# Patient Record
Sex: Male | Born: 1950 | Race: White | Hispanic: No | Marital: Married | Smoking: Current every day smoker
Health system: Southern US, Community
[De-identification: ages and names within clinical notes are randomized; demographics above are authoritative.]

## PROBLEM LIST (undated history)

## (undated) DIAGNOSIS — Z8619 Personal history of other infectious and parasitic diseases: Secondary | ICD-10-CM

## (undated) DIAGNOSIS — M1990 Unspecified osteoarthritis, unspecified site: Secondary | ICD-10-CM

## (undated) DIAGNOSIS — G8929 Other chronic pain: Secondary | ICD-10-CM

## (undated) DIAGNOSIS — I10 Essential (primary) hypertension: Secondary | ICD-10-CM

## (undated) DIAGNOSIS — F419 Anxiety disorder, unspecified: Secondary | ICD-10-CM

## (undated) DIAGNOSIS — T7840XA Allergy, unspecified, initial encounter: Secondary | ICD-10-CM

## (undated) DIAGNOSIS — Z87442 Personal history of urinary calculi: Secondary | ICD-10-CM

## (undated) DIAGNOSIS — F17200 Nicotine dependence, unspecified, uncomplicated: Secondary | ICD-10-CM

## (undated) DIAGNOSIS — K219 Gastro-esophageal reflux disease without esophagitis: Secondary | ICD-10-CM

## (undated) DIAGNOSIS — R011 Cardiac murmur, unspecified: Secondary | ICD-10-CM

## (undated) DIAGNOSIS — E119 Type 2 diabetes mellitus without complications: Secondary | ICD-10-CM

## (undated) DIAGNOSIS — M545 Low back pain, unspecified: Secondary | ICD-10-CM

## (undated) DIAGNOSIS — Z87438 Personal history of other diseases of male genital organs: Secondary | ICD-10-CM

## (undated) HISTORY — DX: Nicotine dependence, unspecified, uncomplicated: F17.200

## (undated) HISTORY — DX: Other chronic pain: G89.29

## (undated) HISTORY — DX: Personal history of urinary calculi: Z87.442

## (undated) HISTORY — DX: Type 2 diabetes mellitus without complications: E11.9

## (undated) HISTORY — DX: Low back pain: M54.5

## (undated) HISTORY — DX: Low back pain, unspecified: M54.50

## (undated) HISTORY — DX: Unspecified osteoarthritis, unspecified site: M19.90

## (undated) HISTORY — PX: FINGER SURGERY: SHX640

## (undated) HISTORY — DX: Cardiac murmur, unspecified: R01.1

## (undated) HISTORY — DX: Allergy, unspecified, initial encounter: T78.40XA

## (undated) HISTORY — DX: Anxiety disorder, unspecified: F41.9

## (undated) HISTORY — DX: Gastro-esophageal reflux disease without esophagitis: K21.9

## (undated) HISTORY — DX: Personal history of other diseases of male genital organs: Z87.438

## (undated) HISTORY — DX: Personal history of other infectious and parasitic diseases: Z86.19

---

## 1997-08-30 ENCOUNTER — Emergency Department (HOSPITAL_COMMUNITY): Admission: EM | Admit: 1997-08-30 | Discharge: 1997-08-30 | Payer: Self-pay | Admitting: Emergency Medicine

## 2003-10-05 ENCOUNTER — Emergency Department (HOSPITAL_COMMUNITY): Admission: EM | Admit: 2003-10-05 | Discharge: 2003-10-05 | Payer: Self-pay | Admitting: Emergency Medicine

## 2004-05-01 ENCOUNTER — Ambulatory Visit: Payer: Self-pay | Admitting: Family Medicine

## 2004-08-21 ENCOUNTER — Ambulatory Visit: Payer: Self-pay | Admitting: Family Medicine

## 2005-05-03 ENCOUNTER — Encounter: Admission: RE | Admit: 2005-05-03 | Discharge: 2005-05-03 | Payer: Self-pay | Admitting: Orthopaedic Surgery

## 2005-07-19 ENCOUNTER — Ambulatory Visit: Payer: Self-pay | Admitting: Family Medicine

## 2005-09-25 ENCOUNTER — Ambulatory Visit: Payer: Self-pay | Admitting: Family Medicine

## 2006-07-07 ENCOUNTER — Ambulatory Visit: Payer: Self-pay | Admitting: Family Medicine

## 2007-01-19 ENCOUNTER — Emergency Department (HOSPITAL_COMMUNITY): Admission: EM | Admit: 2007-01-19 | Discharge: 2007-01-19 | Payer: Self-pay | Admitting: *Deleted

## 2007-06-08 ENCOUNTER — Ambulatory Visit: Payer: Self-pay | Admitting: Family Medicine

## 2007-06-08 DIAGNOSIS — J1189 Influenza due to unidentified influenza virus with other manifestations: Secondary | ICD-10-CM | POA: Insufficient documentation

## 2007-06-08 LAB — CONVERTED CEMR LAB: Inflenza A Ag: POSITIVE

## 2007-06-09 ENCOUNTER — Encounter: Payer: Self-pay | Admitting: Internal Medicine

## 2007-06-09 DIAGNOSIS — A6920 Lyme disease, unspecified: Secondary | ICD-10-CM

## 2007-06-09 DIAGNOSIS — Z87448 Personal history of other diseases of urinary system: Secondary | ICD-10-CM

## 2007-08-11 ENCOUNTER — Ambulatory Visit: Payer: Self-pay | Admitting: Family Medicine

## 2007-08-11 DIAGNOSIS — R439 Unspecified disturbances of smell and taste: Secondary | ICD-10-CM

## 2008-05-06 DIAGNOSIS — E119 Type 2 diabetes mellitus without complications: Secondary | ICD-10-CM

## 2008-05-06 HISTORY — DX: Type 2 diabetes mellitus without complications: E11.9

## 2008-07-05 ENCOUNTER — Ambulatory Visit: Payer: Self-pay | Admitting: Internal Medicine

## 2008-07-05 DIAGNOSIS — M545 Low back pain: Secondary | ICD-10-CM

## 2008-08-04 ENCOUNTER — Ambulatory Visit: Payer: Self-pay | Admitting: Family Medicine

## 2008-08-05 ENCOUNTER — Encounter: Payer: Self-pay | Admitting: Family Medicine

## 2008-08-18 ENCOUNTER — Ambulatory Visit: Payer: Self-pay | Admitting: Family Medicine

## 2008-08-24 ENCOUNTER — Ambulatory Visit: Payer: Self-pay | Admitting: Family Medicine

## 2008-10-04 ENCOUNTER — Telehealth: Payer: Self-pay | Admitting: Family Medicine

## 2008-12-08 ENCOUNTER — Encounter (INDEPENDENT_AMBULATORY_CARE_PROVIDER_SITE_OTHER): Payer: Self-pay | Admitting: *Deleted

## 2009-02-23 ENCOUNTER — Ambulatory Visit: Payer: Self-pay | Admitting: Family Medicine

## 2009-02-23 LAB — CONVERTED CEMR LAB
ALT: 29 units/L (ref 0–53)
AST: 20 units/L (ref 0–37)
Albumin: 4 g/dL (ref 3.5–5.2)
Alkaline Phosphatase: 74 units/L (ref 39–117)
BUN: 12 mg/dL (ref 6–23)
Basophils Absolute: 0.2 10*3/uL — ABNORMAL HIGH (ref 0.0–0.1)
Basophils Relative: 1.6 % (ref 0.0–3.0)
Bilirubin, Direct: 0 mg/dL (ref 0.0–0.3)
CO2: 25 meq/L (ref 19–32)
Calcium: 9.3 mg/dL (ref 8.4–10.5)
Chloride: 107 meq/L (ref 96–112)
Cholesterol: 187 mg/dL (ref 0–200)
Creatinine, Ser: 0.9 mg/dL (ref 0.4–1.5)
Direct LDL: 133.5 mg/dL
Eosinophils Absolute: 0.3 10*3/uL (ref 0.0–0.7)
Eosinophils Relative: 2.7 % (ref 0.0–5.0)
GFR calc non Af Amer: 92.27 mL/min (ref >60)
Glucose, Bld: 127 mg/dL — ABNORMAL HIGH (ref 70–99)
HCT: 46.7 % (ref 39.0–52.0)
HDL: 29.8 mg/dL — ABNORMAL LOW (ref >39.00)
Hemoglobin: 15.5 g/dL (ref 13.0–17.0)
Lymphocytes Relative: 41.2 % (ref 12.0–46.0)
Lymphs Abs: 4.7 10*3/uL — ABNORMAL HIGH (ref 0.7–4.0)
MCHC: 33.3 g/dL (ref 30.0–36.0)
MCV: 96 fL (ref 78.0–100.0)
Monocytes Absolute: 0.7 10*3/uL (ref 0.1–1.0)
Monocytes Relative: 6 % (ref 3.0–12.0)
Neutro Abs: 5.4 10*3/uL (ref 1.4–7.7)
Neutrophils Relative %: 48.5 % (ref 43.0–77.0)
PSA: 0.52 ng/mL (ref 0.10–4.00)
Platelets: 240 10*3/uL (ref 150.0–400.0)
Potassium: 4 meq/L (ref 3.5–5.1)
RBC: 4.86 M/uL (ref 4.22–5.81)
RDW: 12.8 % (ref 11.5–14.6)
Sodium: 141 meq/L (ref 135–145)
Total Bilirubin: 0.5 mg/dL (ref 0.3–1.2)
Total CHOL/HDL Ratio: 6
Total Protein: 6.5 g/dL (ref 6.0–8.3)
Triglycerides: 217 mg/dL — ABNORMAL HIGH (ref 0.0–149.0)
VLDL: 43.4 mg/dL — ABNORMAL HIGH (ref 0.0–40.0)
WBC: 11.3 10*3/uL — ABNORMAL HIGH (ref 4.5–10.5)

## 2009-02-26 LAB — CONVERTED CEMR LAB: Vit D, 25-Hydroxy: 20 ng/mL — ABNORMAL LOW (ref 30–89)

## 2009-03-02 ENCOUNTER — Ambulatory Visit: Payer: Self-pay | Admitting: Family Medicine

## 2009-03-02 DIAGNOSIS — E118 Type 2 diabetes mellitus with unspecified complications: Secondary | ICD-10-CM | POA: Insufficient documentation

## 2009-03-02 DIAGNOSIS — E1165 Type 2 diabetes mellitus with hyperglycemia: Secondary | ICD-10-CM

## 2009-06-22 ENCOUNTER — Encounter (INDEPENDENT_AMBULATORY_CARE_PROVIDER_SITE_OTHER): Payer: Self-pay | Admitting: *Deleted

## 2009-07-04 ENCOUNTER — Ambulatory Visit: Payer: Self-pay | Admitting: Family Medicine

## 2009-07-04 LAB — CONVERTED CEMR LAB
BUN: 10 mg/dL (ref 6–23)
CO2: 28 meq/L (ref 19–32)
Calcium: 9 mg/dL (ref 8.4–10.5)
Chloride: 110 meq/L (ref 96–112)
Creatinine, Ser: 0.9 mg/dL (ref 0.4–1.5)
GFR calc non Af Amer: 92.16 mL/min (ref >60)
Glucose, Bld: 153 mg/dL — ABNORMAL HIGH (ref 70–99)
Potassium: 3.9 meq/L (ref 3.5–5.1)
Sodium: 140 meq/L (ref 135–145)

## 2009-07-05 LAB — CONVERTED CEMR LAB: Vit D, 25-Hydroxy: 19 ng/mL — ABNORMAL LOW (ref 30–89)

## 2009-07-11 ENCOUNTER — Ambulatory Visit: Payer: Self-pay | Admitting: Family Medicine

## 2009-07-11 LAB — CONVERTED CEMR LAB
Glucose, Bld: 164 mg/dL — ABNORMAL HIGH (ref 70–99)
Hgb A1c MFr Bld: 7.3 % — ABNORMAL HIGH (ref 4.6–6.5)

## 2009-07-12 DIAGNOSIS — E559 Vitamin D deficiency, unspecified: Secondary | ICD-10-CM | POA: Insufficient documentation

## 2009-07-14 ENCOUNTER — Telehealth: Payer: Self-pay | Admitting: Family Medicine

## 2009-07-18 ENCOUNTER — Ambulatory Visit: Payer: Self-pay | Admitting: Family Medicine

## 2009-10-06 ENCOUNTER — Ambulatory Visit: Payer: Self-pay | Admitting: Family Medicine

## 2009-10-06 LAB — CONVERTED CEMR LAB
BUN: 18 mg/dL (ref 6–23)
CO2: 29 meq/L (ref 19–32)
Calcium: 9.3 mg/dL (ref 8.4–10.5)
Chloride: 107 meq/L (ref 96–112)
Creatinine, Ser: 0.8 mg/dL (ref 0.4–1.5)
GFR calc non Af Amer: 102.52 mL/min (ref >60)
Glucose, Bld: 103 mg/dL — ABNORMAL HIGH (ref 70–99)
Hgb A1c MFr Bld: 6.2 % (ref 4.6–6.5)
Potassium: 4.6 meq/L (ref 3.5–5.1)
Sodium: 143 meq/L (ref 135–145)

## 2009-10-07 LAB — CONVERTED CEMR LAB: Vit D, 25-Hydroxy: 27 ng/mL — ABNORMAL LOW (ref 30–89)

## 2009-10-10 ENCOUNTER — Ambulatory Visit: Payer: Self-pay | Admitting: Family Medicine

## 2009-12-12 ENCOUNTER — Encounter (INDEPENDENT_AMBULATORY_CARE_PROVIDER_SITE_OTHER): Payer: Self-pay | Admitting: *Deleted

## 2010-06-05 NOTE — Assessment & Plan Note (Signed)
Summary: DISCUSS MED/ ALC   Vital Signs:  Patient profile:   60 year old male Weight:      222 pounds Temp:     98.0 degrees F oral Pulse rate:   68 / minute Pulse rhythm:   regular BP sitting:   114 / 70  (left arm) Cuff size:   large  Vitals Entered By: Sydell Axon LPN (July 18, 2009 11:55 AM) CC: Discuss medication, had to stop the Metformin because it caused diarrhea   History of Present Illness: Pt here because rthe metformin would cause "pure water" diarrhea when he would take it and rthen have stomach upset and cramping for the next three days. He tried it twice. He needs something else. He also wonders if he should be checking his sugar. He also had questions about Agavbi Juice thaat he uses as a sweetener and ingestion of OJ.  Problems Prior to Update: 1)  Unspecified Vitamin D Deficiency  (ICD-268.9) 2)  Diabetes Mellitus  (ICD-250.00) 3)  Special Screening Malignant Neoplasm of Prostate  (ICD-V76.44) 4)  Routine General Medical Exam@health  Care Facl  (ICD-V70.0) 5)  Low Back Pain, Chronic  (ICD-724.2) 6)  Disturbances of Sensation of Smell and Taste  (ICD-781.1) 7)  Lyme Disease  (ICD-088.81) 8)  Prostatitis, Hx of  (ICD-V13.09) 9)  Influenza With Other Manifestations  (ICD-487.8)  Medications Prior to Update: 1)  Bayer Aspirin 325 Mg  Tabs (Aspirin) .... Takes As Needed As Needed 2)  Metformin Hcl 850 Mg Tabs (Metformin Hcl) .... One Tab By Mouth At Night. (Holding 07/14/09 For Diarrhea) 3)  Tramadol Hcl 50 Mg Tabs (Tramadol Hcl) .... One Tab By Mouth Once Daily For Back Pain.  Allergies: 1)  ! * Diclofenac Sodium 2)  ! Demerol 3)  ! Penicillin 4)  ! Codeine 5)  ! Percodan 6)  ! Oxycodone Hcl 7)  ! Keflex 8)  ! Cipro 9)  ! Flexeril 10)  ! Talwin 11)  ! Tylox 12)  ! Motrin 13)  ! * Quaifenesin 14)  ! Metformin Hcl 15)  ! * Bee Sting  Physical Exam  General:  Well-developed,well-nourished,in no acute distress; alert,appropriate and cooperative  throughout examination, mildly obese. Head:  Normocephalic and atraumatic without obvious abnormalities. No apparent alopecia or balding. Eyes:  Conjunctiva clear bilaterally.  Ears:  External ear exam shows no significant lesions or deformities.  Otoscopic examination reveals clear canals, tympanic membranes are intact bilaterally without bulging, retraction, inflammation or discharge. Hearing is grossly normal bilaterally. Nose:  no mucosal edema, no airflow obstruction, and mucosal erythema.   Mouth:  tongue slightly eryth, no edema or other lesions on buccal mucosa, under tongue.  does have eryth patches in pharnyx and roof of mouth,    Impression & Recommendations:  Problem # 1:  DIABETES MELLITUS (ICD-250.00) Assessment Unchanged Intolerant to metformin due to diarrhea and stomach cramps. Will try Actos.  Discussed Sulfonylureas and desire to avoid to preserve beta function. Discussed Agavi juice and OJ as well as use of Splenda(do not use).  Discussed 4 day progression of monitoring  glucose.  The following medications were removed from the medication list:    Metformin Hcl 850 Mg Tabs (Metformin hcl) ..... One tab by mouth at night. (holding 07/14/09 for diarrhea) His updated medication list for this problem includes:    Bayer Aspirin 325 Mg Tabs (Aspirin) .Marland Kitchen... Takes as needed as needed    Actos 30 Mg Tabs (Pioglitazone hcl) ..... One tab by mouth once daily  Complete Medication List: 1)  Bayer Aspirin 325 Mg Tabs (Aspirin) .... Takes as needed as needed 2)  Tramadol Hcl 50 Mg Tabs (Tramadol hcl) .... One tab by mouth once daily for back pain. 3)  Actos 30 Mg Tabs (Pioglitazone hcl) .... One tab by mouth once daily  Patient Instructions: 1)  RTC as scheduled already. Prescriptions: ACTOS 30 MG TABS (PIOGLITAZONE HCL) one tab by mouth once daily  #30 x 12   Entered and Authorized by:   Shaune Leeks MD   Signed by:   Shaune Leeks MD on 07/18/2009   Method used:    Electronically to        [facility]* (retail)       [address]       [place], [place]  [address]       Ph: [phone]       Fax: [phone]   RxID:   [id number]   Current Allergies (reviewed today): ! * DICLOFENAC SODIUM ! DEMEROL ! PENICILLIN ! CODEINE ! PERCODAN ! OXYCODONE HCL ! KEFLEX ! CIPRO ! FLEXERIL ! TALWIN ! TYLOX ! MOTRIN ! * QUAIFENESIN ! METFORMIN HCL ! * BEE STING

## 2010-06-05 NOTE — Letter (Signed)
Summary: Mathew Lamb letter  Mathew Lamb at [facility]  [address] [place], [place] [address]   Phone: [phone]  Fax: [phone]       12/12/2009 MRN: [id number]  Mathew Lamb [address] [place], [place]  [address]  Dear [facility] [place], and  announce the retirement of Mathew Lamb, M.D., from full-time practice at the [facility] office effective November 02, 2009 and his plans of returning part-time.  It is important to Mathew Lamb and to our practice that you understand that [facility] has seven physicians in our office for your health care needs.  We will continue to offer the same exceptional care that you have today.    Mathew Lamb has spoken to many of you about his plans for retirement and returning part-time in the fall.   We will continue to work with you through the transition to schedule appointments for you in the office and meet the high standards that Scott is committed to.   Again, it is with great pleasure that we share the news that Mathew Lamb will return to [facility] in October of 2011 with a reduced schedule.    If you have any questions, or would like to request an appointment with one of our physicians, please call us at [phone] and press the option for Scheduling an appointment.  We take pleasure in providing you with excellent patient care and look forward to seeing you at your next office visit.  Our [facility] Physicians are:  Mathew Lamb, M.D. Mathew Lamb, M.D. Mathew Lamb, M.D. Mathew Lamb, M.D. Mathew Lamb, M.D. Mathew Lamb, M.D. We proudly welcomed Mathew Lamb, M.D. and Mathew Lamb, M.D. to the practice in July/August 2011.  Sincerely,  [place] [facility]

## 2010-06-05 NOTE — Letter (Signed)
Summary: [place] No Show Letter  [place] [facility]  [address] [place], [place] [address]   Phone: [phone]  Fax: [phone]    06/22/2009 MRN: [id number]  GAY RAPE [address] [place], [place]  [address]   Dear Mr. Hicklin,   Our records indicate that you missed your scheduled appointment with _____lab________________ on ___2.17.11_________.  Please contact this office to reschedule your appointment as soon as possible.  It is important that you keep your scheduled appointments with your physician, so we can provide you the best care possible.  Please be advised that there may be a charge for "no show" appointments.    Sincerely,   [place] at [facility]

## 2010-06-05 NOTE — Assessment & Plan Note (Signed)
Summary: 4 M F/U DLO   Vital Signs:  Patient profile:   60 year old male Weight:      225 pounds BMI:     36.45 Temp:     98.1 degrees F oral Pulse rate:   84 / minute Pulse rhythm:   regular BP sitting:   112 / 70  (left arm) Cuff size:   large  Vitals Entered By: Sydell Axon LPN (July 12, 863 10:19 AM) CC: 4 Month follow-up after labs   History of Present Illness: Pt here for recheck.  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  He had noticed his vision blurry a few months ago when shooting through a scope. It has not been happening routinely. He was encouraged previously to avoid sweets and carbs due to hyperglycemia. He has been taking Vit D 1000Iu daily since last time.   Preventive Screening-Counseling & Management  Alcohol-Tobacco     Alcohol drinks/day: 0     Smoking Status: current     Packs/Day: 2.0     Year Started: 1959 lit cigarettes for parents initially  Caffeine-Diet-Exercise     Caffeine use/day: Pepsi 2liter a day     Does Patient Exercise: no  Problems Prior to Update: 1)  Unspecified Vitamin D Deficiency  (ICD-268.9) 2)  Hyperglycemia  (ICD-790.29) 3)  Special Screening Malignant Neoplasm of Prostate  (ICD-V76.44) 4)  Routine General Medical Exam@health  Care Facl  (ICD-V70.0) 5)  Low Back Pain, Chronic  (ICD-724.2) 6)  Disturbances of Sensation of Smell and Taste  (ICD-781.1) 7)  Lyme Disease  (ICD-088.81) 8)  Prostatitis, Hx of  (ICD-V13.09) 9)  Influenza With Other Manifestations  (ICD-487.8)  Medications Prior to Update: 1)  Bayer Aspirin 325 Mg  Tabs (Aspirin) .... Takes As Needed As Needed 2)  Darvocet-N 100 100-650 Mg Tabs (Propoxyphene N-Apap) .Marland Kitchen.. 1 As Needed Severe Back Pain  Allergies: 1)  ! * Diclofenac Sodium 2)  ! Demerol 3)  ! Penicillin 4)  ! Codeine 5)  ! Percodan 6)  ! Oxycodone Hcl 7)  ! Keflex 8)  ! Cipro 9)  ! Flexeril 10)  ! Talwin 11)  !  Tylox 12)  ! Motrin 13)  ! * Quaifenesin 14)  ! * Bee Sting  Past History:  Past Surgical History: Last updated: 03/02/2009 surgery on R second finger         UROLOGIST 07/2002:(DR. LOYD PETERSON) Kidney Stones multiple times  Family History: Last updated: 03/02/2009 Father: dec 67 Etohic Artificial right hip...had PE Mother: dec 54 Etohic Cirrhosis DM Siblings: Brother A 65 Congenital Heart Murmur Endocarditis St Jude artificial valve Pacer  Social History: Last updated: 07/05/2008 Marital Status: Married//3RD MARRIAGE X 10 YEARS Children: 1 SON Occupation: truck driver  Risk Factors: Alcohol Use: 0 (07/11/2009) Caffeine Use: Pepsi 2liter a day (07/11/2009) Exercise: no (07/11/2009)  Risk Factors: Smoking Status: current (07/11/2009) Packs/Day: 2.0 (07/11/2009)  Physical Exam  General:  Well-developed,well-nourished,in no acute distress; alert,appropriate and cooperative throughout examination, mildly obese. Head:  Normocephalic and atraumatic without obvious abnormalities. No apparent alopecia or balding. Eyes:  Conjunctiva clear bilaterally.  Ears:  External ear exam shows no significant lesions or deformities.  Otoscopic examination reveals clear canals, tympanic membranes are intact bilaterally without bulging, retraction, inflammation or discharge. Hearing is grossly normal bilaterally. Nose:  no mucosal edema, no airflow obstruction, and mucosal erythema.   Mouth:  tongue slightly eryth, no edema  or other lesions on buccal mucosa, under tongue.  does have eryth patches in pharnyx and roof of mouth,  Neck:  No deformities, masses, or tenderness noted. Chest Wall:  No deformities, masses, tenderness or gynecomastia noted. Lungs:  normal respiratory effort, no intercostal retractions, no accessory muscle use, and normal breath sounds.   Heart:  Normal rate and regular rhythm. S1 and S2 normal without gallop, murmur, click, rub or other extra sounds. Abdomen:   Bowel sounds positive,abdomen soft and non-tender without masses, organomegaly or hernias noted. Protuberant. Extremities:  No clubbing, cyanosis, edema, or deformity noted with normal full range of motion of all joints.  No edema today but gets regularly with driving.   Impression & Recommendations:  Problem # 1:  UNSPECIFIED VITAMIN D DEFICIENCY (ICD-268.9) Assessment Unchanged No change with 1000Iu daily. Increase to 1 tab two times a day   Problem # 2:  DIABETES MELLITUS (ICD-250.00) Assessment: New  Now officially diabetic. Will get A1C today and repeat fasting BS. Encouraged to be more careful with avoiding sweets and carbs...was given info on diabetes in general and some meal planning ideas. Discussed risks of DM on day to day basis. Start Metformin tonite. Will need ACEI in future and Statin. His updated medication list for this problem includes:    Bayer Aspirin 325 Mg Tabs (Aspirin) .Marland Kitchen... Takes as needed as needed    Metformin Hcl 850 Mg Tabs (Metformin hcl) ..... One tab by mouth at night.  Orders: TLB-Glucose, QUANT (82947-GLU) TLB-A1C / Hgb A1C (Glycohemoglobin) (83036-A1C)  Labs Reviewed: Creat: 0.9 (07/04/2009)     Complete Medication List: 1)  Bayer Aspirin 325 Mg Tabs (Aspirin) .... Takes as needed as needed 2)  Metformin Hcl 850 Mg Tabs (Metformin hcl) .... One tab by mouth at night. 3)  Tramadol Hcl 50 Mg Tabs (Tramadol hcl) .... One tab by mouth once daily for back pain.  Patient Instructions: 1)  RTC 3 mos for recheck. Labs prior.Bmet and A1C 250.00 2)  Increase Vit D to 1000Iu two times a day, recheck next visit. 3)  Get eye exam after next visit.  Prescriptions: TRAMADOL HCL 50 MG TABS (TRAMADOL HCL) one tab by mouth once daily for back pain.  #30 x 0   Entered and Authorized by:   Shaune Leeks MD   Signed by:   Shaune Leeks MD on 07/11/2009   Method used:   Electronically to        [facility]* (retail)       [address]       [place], [place]  [address]       Ph: [phone]       Fax: [phone]   RxID:   [id number] METFORMIN HCL 850 MG TABS (METFORMIN HCL) one tab by mouth at night.  #30 x 12   Entered and Authorized by:   Shaune Leeks MD   Signed by:   Shaune Leeks MD on 07/11/2009   Method used:   Electronically to        [facility]* (retail)       [address]       [place], [place]  [address]       Ph: [phone]       Fax: [phone]   RxID:   [id number]   Current Allergies (reviewed today): ! * DICLOFENAC SODIUM ! DEMEROL ! PENICILLIN ! CODEINE ! PERCODAN ! OXYCODONE HCL ! KEFLEX ! CIPRO ! FLEXERIL ! TALWIN ! TYLOX !  MOTRIN ! * QUAIFENESIN ! * BEE STING  Appended Document: 4 M F/U DLO Told to avoid salt and wear support hose when driving to avoid LE swelling. Also suggested walking around his rig 5-6 times each time he gets in or out of the truck.

## 2010-06-05 NOTE — Assessment & Plan Note (Signed)
Summary: F/U AFTER LABS / LFW   Vital Signs:  Patient profile:   60 year old male Weight:      215.25 pounds Temp:     97.8 degrees F oral Pulse rate:   76 / minute Pulse rhythm:   regular BP sitting:   104 / 68  (left arm) Cuff size:   large  Vitals Entered By: Sydell Axon LPN (October 11, 4538 9:08 AM) CC: 3 month follow-up after labs   History of Present Illness: Pt here for 3 month followup...Marland Kitchenhe has been doing a 4 day progression of monitoring and has found out lots of interesting things...he can't tolerate white rice because his sugar goes out of sight to over 200. He doesn't currently tolerate Glu to 87 or so. He has lost 7 pounds since here 3 mos ago. He feels a lot better. He is tolerating Actos fine. His diarrhea has resolved.  Problems Prior to Update: 1)  Unspecified Vitamin D Deficiency  (ICD-268.9) 2)  Diabetes Mellitus  (ICD-250.00) 3)  Special Screening Malignant Neoplasm of Prostate  (ICD-V76.44) 4)  Routine General Medical Exam@health  Care Facl  (ICD-V70.0) 5)  Low Back Pain, Chronic  (ICD-724.2) 6)  Disturbances of Sensation of Smell and Taste  (ICD-781.1) 7)  Lyme Disease  (ICD-088.81) 8)  Prostatitis, Hx of  (ICD-V13.09) 9)  Influenza With Other Manifestations  (ICD-487.8)  Medications Prior to Update: 1)  Bayer Aspirin 325 Mg  Tabs (Aspirin) .... Takes As Needed As Needed 2)  Tramadol Hcl 50 Mg Tabs (Tramadol Hcl) .... One Tab By Mouth Once Daily For Back Pain. 3)  Actos 30 Mg Tabs (Pioglitazone Hcl) .... One Tab By Mouth Once Daily  Allergies: 1)  ! * Diclofenac Sodium 2)  ! Demerol 3)  ! Penicillin 4)  ! Codeine 5)  ! Percodan 6)  ! Oxycodone Hcl 7)  ! Keflex 8)  ! Cipro 9)  ! Flexeril 10)  ! Talwin 11)  ! Tylox 12)  ! Motrin 13)  ! * Quaifenesin 14)  ! Metformin Hcl 15)  ! * Bee Sting  Physical Exam  General:  Well-developed,well-nourished,in no acute distress; alert,appropriate and cooperative throughout examination, mildly  obese. Head:  Normocephalic and atraumatic without obvious abnormalities. No apparent alopecia or balding. Eyes:  Conjunctiva clear bilaterally.  Ears:  External ear exam shows no significant lesions or deformities.  Otoscopic examination reveals clear canals, tympanic membranes are intact bilaterally without bulging, retraction, inflammation or discharge. Hearing is grossly normal bilaterally. Nose:  no mucosal edema, no airflow obstruction, and mucosal erythema.   Mouth:  tongue slightly eryth, no edema or other lesions on buccal mucosa, under tongue.  does have eryth patches in pharnyx and roof of mouth,  Neck:  No deformities, masses, or tenderness noted. Lungs:  normal respiratory effort, no intercostal retractions, no accessory muscle use, and normal breath sounds.   Heart:  Normal rate and regular rhythm. S1 and S2 normal without gallop, murmur, click, rub or other extra sounds.   Impression & Recommendations:  Problem # 1:  DIABETES MELLITUS (ICD-250.00) Assessment Improved  Much better. A1C 6.2 and his 4 day progression has been very enlightening to him. Cont curr therapy and monitoring.  His updated medication list for this problem includes:    Bayer Aspirin 325 Mg Tabs (Aspirin) .Marland Kitchen... Takes as needed as needed    Actos 30 Mg Tabs (Pioglitazone hcl) ..... One tab by mouth once daily  Labs Reviewed: Creat: 0.8 (10/06/2009)  Reviewed HgBA1c results: 6.2 (10/06/2009)  7.3 (07/11/2009)  Problem # 2:  UNSPECIFIED VITAMIN D DEFICIENCY (ICD-268.9) Assessment: Unchanged Start back on Vit D suppl 400-1000Iu daily, whatever he finds. Wikll recheck in the future.  Complete Medication List: 1)  Bayer Aspirin 325 Mg Tabs (Aspirin) .... Takes as needed as needed 2)  Tramadol Hcl 50 Mg Tabs (Tramadol hcl) .... One tab by mouth once daily for back pain. 3)  Actos 30 Mg Tabs (Pioglitazone hcl) .... One tab by mouth once daily  Patient Instructions: 1)  RTC 10/11 for recheck, A1C and  Vit D lvl then.  Current Allergies (reviewed today): ! * DICLOFENAC SODIUM ! DEMEROL ! PENICILLIN ! CODEINE ! PERCODAN ! OXYCODONE HCL ! KEFLEX ! CIPRO ! FLEXERIL ! TALWIN ! TYLOX ! MOTRIN ! * QUAIFENESIN ! METFORMIN HCL ! * BEE STING

## 2010-06-05 NOTE — Progress Notes (Signed)
Summary: metformin  Phone Note Call from Patient Call back at Home Phone [phone]   Caller: Patient Call For: Shaune Leeks MD Summary of Call: Patient was diagnosed with diabetes and was given metformin. He says that he took it for the first time on Tuesday and withing 30 min he was having diarrhea that lasted through the night. He says that the next day he was doing fine until he took the metformin. He says that the same thing happened yesterday. He feels that he needs to try a different med. 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 Please advise. Uses [facility].  Initial call taken by: Melody Comas,  July 14, 2009 3:21 PM  Follow-up for Phone Call        for now stop the metformin will route this to Dr Hetty Ely for further adv when he returns  update me if diarrhea does not improve Follow-up by: Judith Part MD,  July 14, 2009 4:18 PM  Additional Follow-up for Phone Call Additional follow up Details #1::        Have pt come in next week if poss and agree with stopping aMetformin. Additional Follow-up by: Shaune Leeks MD,  July 14, 2009 4:49 PM   New Allergies: ! METFORMIN HCL Additional Follow-up for Phone Call Additional follow up Details #2::    Patient agreed to stop metformin. Coming in Tuesday for app.   Follow-up by: Melody Comas,  July 14, 2009 5:10 PM  New/Updated Medications: METFORMIN HCL 850 MG TABS (METFORMIN HCL) one tab by mouth at night. (holding 07/14/09 for diarrhea) New Allergies: ! METFORMIN HCL

## 2010-09-21 NOTE — Assessment & Plan Note (Signed)
[facility]                                   ON-CALL NOTE   NAME:[name], [name]                        MRN:          [id number]  DATE:02/09/2006                            DOB:          [birth year]    Mr. Cleavenger' wife calls in stating that she believes he has an abscessed  tooth that is causing him a lot of discomfort.  He has attempted to call his  oral surgeon, as well as his dentist, without success.  She was wondering if  I could call in some clindamycin since he has adverse reactions to many  other antibiotics.   PLAN:  I advised Mrs. Andria Meuse that it would be more ideal that he be  evaluated by a physician today either at an urgent care or an emergency  department to actually confirm his cause of pain and be treated  appropriately.  She expressed understanding.            ______________________________  Leanne Chang, M.D.      LA/MedQ  DD:  02/09/2006  DT:  02/10/2006  Job #:  [id number]   cc:   Arta Silence, MD

## 2010-11-12 ENCOUNTER — Ambulatory Visit (INDEPENDENT_AMBULATORY_CARE_PROVIDER_SITE_OTHER): Payer: Managed Care, Other (non HMO) | Admitting: Family Medicine

## 2010-11-12 ENCOUNTER — Encounter: Payer: Self-pay | Admitting: Family Medicine

## 2010-11-12 VITALS — BP 120/68 | HR 88 | Temp 98.4°F | Wt 216.0 lb

## 2010-11-12 DIAGNOSIS — J329 Chronic sinusitis, unspecified: Secondary | ICD-10-CM

## 2010-11-12 MED ORDER — AZITHROMYCIN 250 MG PO TABS: ORAL_TABLET | ORAL | Status: AC

## 2010-11-12 MED ORDER — TRAMADOL-ACETAMINOPHEN 37.5-325 MG PO TABS: 1.0000 | ORAL_TABLET | Freq: Four times a day (QID) | ORAL | Status: AC | PRN

## 2010-11-12 NOTE — Progress Notes (Signed)
SUBJECTIVE:  Mathew Lamb is a 60 y.o. male who complains of coryza, congestion, sore throat, nasal blockage, post nasal drip, dry cough, bilateral sinus pain and fever for 7 days. He denies a history of chest pain, chills, shortness of breath, vomiting and weakness and denies a history of asthma.  Taking Mucinex DM twice daily with no improvement of symptoms.   Patient Active Problem List  Diagnoses  . LYME DISEASE  . DIABETES MELLITUS  . UNSPECIFIED VITAMIN D DEFICIENCY  . INFLUENZA WITH OTHER MANIFESTATIONS  . LOW BACK PAIN, CHRONIC  . DISTURBANCES OF SENSATION OF SMELL AND TASTE  . PROSTATITIS, HX OF   No past medical history on file. Past Surgical History  Procedure Date  . Finger surgery     right second finger   History  Substance Use Topics  . Smoking status: Not on file  . Smokeless tobacco: Not on file  . Alcohol Use:    Family History  Problem Relation Age of Onset  . Diabetes Mother   . Cirrhosis Mother   . Pulmonary embolism Father    Allergies  Allergen Reactions  . Cephalexin     REACTION: UNSPECIFIED  . Ciprofloxacin     REACTION: GI UPSET  . Codeine     REACTION: RASH  . Cyclobenzaprine Hcl     REACTION: HIVES  . Diclofenac Sodium     REACTION: GI UPSET  . Ibuprofen     REACTION: UNSPECIFIED  . Meperidine Hcl     REACTION: SWELLING  . Metformin     REACTION: diarrhea  . Oxycodone Hcl     REACTION: SWELLING  . Oxycodone-Acetaminophen     REACTION: UNSPECIFIED  . Oxycodone-Aspirin     REACTION: SWELLING  . Penicillins     REACTION: SHORT OF BREATH  . Pentazocine Lactate     REACTION: UNSPECIFIED   The PMH, PSH, Social History, Family History, Medications, and allergies have been reviewed in [facility], and have been updated if relevant.   OBJECTIVE: BP 120/68  Pulse 88  Temp(Src) 98.4 F (36.9 C) (Oral)  Wt 216 lb (97.977 kg)   He appears well, vital signs are as noted. Ears normal.  Throat and pharynx normal.  Neck supple. No  adenopathy in the neck. Nose is congested. Sinuses + tender, +PND. The chest is clear, without wheezes or rales.  ASSESSMENT:  sinusitis  PLAN: Given duration and progression of symptoms, will treat for bacterial sinusitis. Multiple drug allergies, treat with Zpack. Symptomatic therapy suggested: push fluids, rest and return office visit prn if symptoms persist or worsen.  Call or return to clinic prn if these symptoms worsen or fail to improve as anticipated.

## 2011-02-14 LAB — I-STAT 8, (EC8 V) (CONVERTED LAB)
BUN: 14
Bicarbonate: 24.7 — ABNORMAL HIGH
Chloride: 108
Glucose, Bld: 142 — ABNORMAL HIGH
HCT: 48
Hemoglobin: 16.3
Operator id: 133351
Potassium: 4.7
Sodium: 138
TCO2: 26
pCO2, Ven: 38.3 — ABNORMAL LOW
pH, Ven: 7.418 — ABNORMAL HIGH

## 2011-02-14 LAB — URINE MICROSCOPIC-ADD ON

## 2011-02-14 LAB — POCT I-STAT CREATININE
Creatinine, Ser: 1
Operator id: 133351

## 2011-02-14 LAB — URINALYSIS, ROUTINE W REFLEX MICROSCOPIC
Bilirubin Urine: NEGATIVE
Glucose, UA: NEGATIVE
Ketones, ur: NEGATIVE
Leukocytes, UA: NEGATIVE
Nitrite: NEGATIVE
Protein, ur: NEGATIVE
Specific Gravity, Urine: 1.021
Urobilinogen, UA: 0.2
pH: 5

## 2011-03-26 ENCOUNTER — Ambulatory Visit (INDEPENDENT_AMBULATORY_CARE_PROVIDER_SITE_OTHER): Payer: Managed Care, Other (non HMO) | Admitting: Family Medicine

## 2011-03-26 ENCOUNTER — Encounter: Payer: Self-pay | Admitting: Family Medicine

## 2011-03-26 VITALS — BP 104/74 | HR 88 | Temp 98.2°F | Wt 215.0 lb

## 2011-03-26 DIAGNOSIS — E119 Type 2 diabetes mellitus without complications: Secondary | ICD-10-CM

## 2011-03-26 DIAGNOSIS — K529 Noninfective gastroenteritis and colitis, unspecified: Secondary | ICD-10-CM | POA: Insufficient documentation

## 2011-03-26 DIAGNOSIS — K5289 Other specified noninfective gastroenteritis and colitis: Secondary | ICD-10-CM

## 2011-03-26 DIAGNOSIS — A6920 Lyme disease, unspecified: Secondary | ICD-10-CM

## 2011-03-26 LAB — BASIC METABOLIC PANEL
BUN: 17 mg/dL (ref 6–23)
Calcium: 9.1 mg/dL (ref 8.4–10.5)
Creatinine, Ser: 1 mg/dL (ref 0.4–1.5)
GFR: 78.4 mL/min (ref >60.00)
Glucose, Bld: 140 mg/dL — ABNORMAL HIGH (ref 70–99)
Potassium: 3.8 mEq/L (ref 3.5–5.1)

## 2011-03-26 LAB — CBC WITH DIFFERENTIAL/PLATELET
Basophils Absolute: 0.1 10*3/uL (ref 0.0–0.1)
Eosinophils Absolute: 0.1 10*3/uL (ref 0.0–0.7)
Eosinophils Relative: 0.6 % (ref 0.0–5.0)
HCT: 45.6 % (ref 39.0–52.0)
Hemoglobin: 15.6 g/dL (ref 13.0–17.0)
Lymphocytes Relative: 24 % (ref 12.0–46.0)
Lymphs Abs: 2.9 10*3/uL (ref 0.7–4.0)
MCHC: 34.2 g/dL (ref 30.0–36.0)
MCV: 92 fl (ref 78.0–100.0)
Neutro Abs: 7.8 10*3/uL — ABNORMAL HIGH (ref 1.4–7.7)
Neutrophils Relative %: 64.2 % (ref 43.0–77.0)
Platelets: 177 10*3/uL (ref 150.0–400.0)
RBC: 4.95 Mil/uL (ref 4.22–5.81)
RDW: 13.7 % (ref 11.5–14.6)
WBC: 12.1 10*3/uL — ABNORMAL HIGH (ref 4.5–10.5)

## 2011-03-26 MED ORDER — CIPROFLOXACIN HCL 500 MG PO TABS: ORAL_TABLET | ORAL | Status: DC

## 2011-03-26 NOTE — Progress Notes (Signed)
Addended by: Arta Silence on: 03/26/2011 10:59 AM   Modules accepted: Orders

## 2011-03-26 NOTE — Assessment & Plan Note (Signed)
Recheck when seen by the end of the year.

## 2011-03-26 NOTE — Progress Notes (Signed)
  Subjective:    Patient ID: Mathew Lamb, male    DOB: March 30, 1951, 60 y.o.   MRN: [id number]  HPI Pt here as acute appt for diarrhea and fever for 3 days. He had fever from tick in the remote past. He has had many ticks this summer, unknown if longer than 24 hrs. He hasn't had a tick bite in the last month. He has had fever to 100.3, last known this AM, he has headache in the global head, eating tastes spoiled to him. Saltine cracker tastes nml. He denies ear pain, no rhinitis, no ST except from dry heaving. He denies cough. He has had nausea, better since being on OTC nausea meds. Sun AM he was swimmy headed and then started with diarrhea and mild nausea. He is also very achey. He had not eaten anywhere unusual. He had eaten at cookout with 2 corndogs and fries, Sat nite ate at the cafeteria. Had a salad, chopped steak and a piece of garlic bread.  He is diabetic and was due back here for recheck one year ago. He hasn't had a PE in 2 years. When last seen he had finally figured out control and was checking sugar once a day in a four day progression. He stopped Actos due to the media frenzy. His nos have been in the 110 neighborhood. He has continued the four day progression.  He is taking OTC med for nausea and small amount of Tyl for fever. He has also had three Immodium, one Sun and two yesterday.    Review of SystemsNoncontributory except as above.      Objective:   Physical Exam  Constitutional: He appears well-developed and well-nourished. No distress.  HENT:  Head: Normocephalic and atraumatic.  Right Ear: External ear normal.  Left Ear: External ear normal.  Nose: Nose normal.  Mouth/Throat: Oropharynx is clear and moist.  Eyes: Conjunctivae and EOM are normal. Pupils are equal, round, and reactive to light. Right eye exhibits no discharge. Left eye exhibits no discharge.  Neck: Normal range of motion. Neck supple.  Cardiovascular: Normal rate and regular rhythm.   Pulmonary/Chest:  Effort normal and breath sounds normal. He has no wheezes.  Abdominal: Soft. He exhibits no distension and no mass. There is tenderness (mild thoughout the abdomen.). There is no rebound and no guarding.       Mild hypoactive BS.  Lymphadenopathy:    He has no cervical adenopathy.  Skin: He is not diaphoretic.          Assessment & Plan:

## 2011-03-26 NOTE — Assessment & Plan Note (Signed)
Will check for recurrence.

## 2011-03-26 NOTE — Patient Instructions (Addendum)
RTC by end of year for PE, labs prior. Limit to clear liqs for 24 hrs with one apple today, BRAT tomm and then slowly advance to regular food, avoiding milk and milk products for one week minimum. Take Tyl 500mg  2 tabs three times a day for a few days.  Cont OTC nausea medicine. Take Immodium as needed. Call Fri if no improvement.

## 2011-03-26 NOTE — Assessment & Plan Note (Signed)
See instructions

## 2011-03-27 ENCOUNTER — Telehealth: Payer: Self-pay | Admitting: Family Medicine

## 2011-03-27 LAB — B. BURGDORFI ANTIBODIES: B burgdorferi Ab IgG+IgM: 0.18 {ISR}

## 2011-03-27 NOTE — Telephone Encounter (Signed)
Pt allergic to Cipro and needs another prescription called in to [facility].  Thanks

## 2011-03-27 NOTE — Telephone Encounter (Signed)
Pharmacy advised  

## 2011-03-27 NOTE — Telephone Encounter (Signed)
Spoke with pt and he is doing some better today. Will not use antibiotics since he can't take Cipro and he is improving. Also discussed lab results. Please let pharmacy know we will not prescribe another antibiotic at this point unless sxs worsen.

## 2011-04-09 ENCOUNTER — Encounter: Payer: Self-pay | Admitting: Family Medicine

## 2011-04-09 ENCOUNTER — Ambulatory Visit (INDEPENDENT_AMBULATORY_CARE_PROVIDER_SITE_OTHER): Payer: Managed Care, Other (non HMO) | Admitting: Family Medicine

## 2011-04-09 VITALS — BP 118/76 | HR 76 | Temp 98.2°F | Ht 66.0 in | Wt 216.2 lb

## 2011-04-09 DIAGNOSIS — M79669 Pain in unspecified lower leg: Secondary | ICD-10-CM

## 2011-04-09 DIAGNOSIS — E119 Type 2 diabetes mellitus without complications: Secondary | ICD-10-CM

## 2011-04-09 DIAGNOSIS — Z0000 Encounter for general adult medical examination without abnormal findings: Secondary | ICD-10-CM

## 2011-04-09 DIAGNOSIS — M79609 Pain in unspecified limb: Secondary | ICD-10-CM

## 2011-04-09 DIAGNOSIS — Z125 Encounter for screening for malignant neoplasm of prostate: Secondary | ICD-10-CM

## 2011-04-09 DIAGNOSIS — Z716 Tobacco abuse counseling: Secondary | ICD-10-CM | POA: Insufficient documentation

## 2011-04-09 DIAGNOSIS — F17200 Nicotine dependence, unspecified, uncomplicated: Secondary | ICD-10-CM

## 2011-04-09 DIAGNOSIS — IMO0001 Reserved for inherently not codable concepts without codable children: Secondary | ICD-10-CM

## 2011-04-09 LAB — LIPID PANEL
Cholesterol: 179 mg/dL (ref 0–200)
HDL: 36.8 mg/dL — ABNORMAL LOW (ref >39.00)
LDL Cholesterol: 109 mg/dL — ABNORMAL HIGH (ref 0–99)
Total CHOL/HDL Ratio: 5
Triglycerides: 164 mg/dL — ABNORMAL HIGH (ref 0.0–149.0)
VLDL: 32.8 mg/dL (ref 0.0–40.0)

## 2011-04-09 LAB — MICROALBUMIN / CREATININE URINE RATIO
Creatinine,U: 157.1 mg/dL
Microalb Creat Ratio: 2.4 mg/g (ref 0.0–30.0)
Microalb, Ur: 3.8 mg/dL — ABNORMAL HIGH (ref 0.0–1.9)

## 2011-04-09 LAB — HEPATIC FUNCTION PANEL
ALT: 22 U/L (ref 0–53)
AST: 19 U/L (ref 0–37)
Albumin: 4.2 g/dL (ref 3.5–5.2)
Alkaline Phosphatase: 73 U/L (ref 39–117)
Bilirubin, Direct: 0.1 mg/dL (ref 0.0–0.3)
Total Bilirubin: 0.3 mg/dL (ref 0.3–1.2)
Total Protein: 7 g/dL (ref 6.0–8.3)

## 2011-04-09 LAB — PSA: PSA: 0.68 ng/mL (ref 0.10–4.00)

## 2011-04-09 LAB — HEMOGLOBIN A1C: Hgb A1c MFr Bld: 6.4 % (ref 4.6–6.5)

## 2011-04-09 LAB — TSH: TSH: 1.21 u[IU]/mL (ref 0.35–5.50)

## 2011-04-09 NOTE — Patient Instructions (Signed)
Blood work today.  Sent home with stool card.  think about flu shot. Think about cutting back on smoking, return here if you'd like to further discuss options. We will watch calves for now, if worsening let me know. Good to see you today, call us with questions. Return in 4-6 months for follow up diabetes or as needed.

## 2011-04-09 NOTE — Assessment & Plan Note (Signed)
Encouraged cessation, discussed different methods. Provided with [email or web address].com resources.

## 2011-04-09 NOTE — Progress Notes (Signed)
Subjective:    Patient ID: Mathew Lamb, male    DOB: 1950-09-12, 60 y.o.   MRN: [id number]  HPI CC: CPE  Presents today to transfer care and for CPE.  Hesitant for pharmacotherapy overall.  Calf pain with hunting.  More with walking about 1/4 mile, resting resolves pain.  Also some left ankle pain.  Smoking 2 ppd.  ETT done 10 yrs ago.  DM - stopped actos 2/2 media concern about bladder cancer.  Metformin caused diarrhea.  Preventative: Last cpe 2010. Recent blood work reviewed - elevated sugar to 140 (nonfasting). Tetanus 11/2006 at ER. Flu - declines. Colon screening - requests iFOB. Prostate screening - requests today.  Last saw urology, placed on flomax, not currently taking.  States had rectal exam last week, would like to defer this week.  Caffeine: 3 sodas/day (diet Mt Dew) Lives with wife, 2 dogs 3rd marriage x 10 years (grown children) Occupation: Truck [employer] doctor Activity: hunts Diet: daily fruits/vegetables, no fish, red meat daily  Medications and allergies reviewed and updated in chart.  Past histories reviewed and updated if relevant as below. Patient Active Problem List  Diagnoses  . LYME DISEASE  . DIABETES MELLITUS  . UNSPECIFIED VITAMIN D DEFICIENCY  . INFLUENZA WITH OTHER MANIFESTATIONS  . LOW BACK PAIN, CHRONIC  . DISTURBANCES OF SENSATION OF SMELL AND TASTE  . PROSTATITIS, HX OF  . Gastroenteritis, acute   Past Medical History  Diagnosis Date  . T2DM (type 2 diabetes mellitus) 2010  . History of Lyme disease 1990s  . Vitamin D deficiency   . Chronic low back pain   . History of prostatitis    Past Surgical History  Procedure Date  . Finger surgery     right second finger   History  Substance Use Topics  . Smoking status: Current Everyday Smoker -- 2.0 packs/day for 50 years    Types: Cigarettes  . Smokeless tobacco: Never Used  . Alcohol Use: No   Family History  Problem Relation Age of Onset  . Diabetes Mother   . Cirrhosis Mother    . Alcohol abuse Mother   . Pulmonary embolism Father   . Alcohol abuse Father   . Coronary artery disease Neg Hx   . Stroke Neg Hx   . Cancer Neg Hx    Allergies  Allergen Reactions  . Actos (Pioglitazone Hydrochloride)     Pt prefers not to take  . Cephalexin     REACTION: UNSPECIFIED  . Ciprofloxacin     REACTION: GI UPSET  . Codeine     REACTION: RASH  . Cyclobenzaprine Hcl     REACTION: HIVES  . Diclofenac Sodium     REACTION: GI UPSET  . Meperidine Hcl     REACTION: SWELLING  . Metformin     REACTION: diarrhea  . Oxycodone Hcl     REACTION: SWELLING  . Oxycodone-Acetaminophen     REACTION: UNSPECIFIED  . Oxycodone-Aspirin     REACTION: SWELLING  . Penicillins     REACTION: SHORT OF BREATH  . Pentazocine Lactate     REACTION: UNSPECIFIED   Current Outpatient Prescriptions on File Prior to Visit  Medication Sig Dispense Refill  . aspirin (BAYER ASPIRIN) 325 MG tablet Take 325 mg by mouth daily.        . traMADol (ULTRAM) 50 MG tablet Take 50 mg by mouth daily.         Review of Systems  Constitutional: Positive for fever (recent illness).  Negative for chills, activity change, appetite change, fatigue and unexpected weight change.  HENT: Negative for hearing loss and neck pain.   Eyes: Negative for visual disturbance.  Respiratory: Negative for cough, chest tightness, shortness of breath and wheezing.   Cardiovascular: Negative for chest pain, palpitations and leg swelling.  Gastrointestinal: Positive for nausea, vomiting and diarrhea. Negative for abdominal pain, constipation, blood in stool and abdominal distention.  Genitourinary: Negative for hematuria and difficulty urinating.  Musculoskeletal: Negative for myalgias and arthralgias.  Skin: Negative for rash.  Neurological: Negative for dizziness, seizures, syncope and headaches.  Hematological: Does not bruise/bleed easily.  Psychiatric/Behavioral: Negative for dysphoric mood. The patient is not  nervous/anxious.        Objective:   Physical Exam  Nursing note and vitals reviewed. Constitutional: He is oriented to person, place, and time. He appears well-developed and well-nourished. No distress.  HENT:  Head: Normocephalic and atraumatic.  Right Ear: External ear normal.  Left Ear: External ear normal.  Nose: Nose normal.  Mouth/Throat: Oropharynx is clear and moist. No oropharyngeal exudate.  Eyes: Conjunctivae and EOM are normal. Pupils are equal, round, and reactive to light. No scleral icterus.  Neck: Normal range of motion. Neck supple. No thyromegaly present.  Cardiovascular: Normal rate, regular rhythm, normal heart sounds and intact distal pulses.   No murmur heard. Pulses:      Radial pulses are 2+ on the right side, and 2+ on the left side.       Dorsalis pedis pulses are 2+ on the right side, and 2+ on the left side.       Posterior tibial pulses are 1+ on the right side, and 1+ on the left side.  Pulmonary/Chest: Effort normal and breath sounds normal. No respiratory distress. He has no wheezes. He has no rales.  Abdominal: Soft. Bowel sounds are normal. He exhibits no distension and no mass. There is no tenderness. There is no rebound and no guarding.  Musculoskeletal: Normal range of motion. He exhibits no edema.       No calf pain, no palpable cords  Lymphadenopathy:    He has no cervical adenopathy.  Neurological: He is alert and oriented to person, place, and time.       CN grossly intact, station and gait intact  Skin: Skin is warm and dry. No rash noted.  Psychiatric: He has a normal mood and affect. His behavior is normal. Judgment and thought content normal.      Assessment & Plan:

## 2011-04-09 NOTE — Assessment & Plan Note (Signed)
Reviewed preventative protocols and updated unless pt declined. discussed importance of smoking cessation. Declines flu.  utd tetanus. Requests iFOB. States DRE 2 wks ago, although no records in Dr. Loni Muse note.  PSA today.

## 2011-04-09 NOTE — Assessment & Plan Note (Signed)
L>R.  Mild. Adequate pulses on exam today. Discussed ABIs, possible surgery if abnormal. Pt does not want to pursue further eval at this time. Knows red flags to seek urgent care, will notify me if decides to pursue.

## 2011-04-09 NOTE — Assessment & Plan Note (Signed)
Check A1c today  If elevated, return sooner for f/u.

## 2011-07-15 ENCOUNTER — Encounter: Payer: Self-pay | Admitting: Family Medicine

## 2011-07-15 ENCOUNTER — Ambulatory Visit (INDEPENDENT_AMBULATORY_CARE_PROVIDER_SITE_OTHER): Payer: Managed Care, Other (non HMO) | Admitting: Family Medicine

## 2011-07-15 VITALS — BP 126/76 | HR 80 | Temp 98.3°F | Wt 209.8 lb

## 2011-07-15 DIAGNOSIS — F17200 Nicotine dependence, unspecified, uncomplicated: Secondary | ICD-10-CM

## 2011-07-15 DIAGNOSIS — J40 Bronchitis, not specified as acute or chronic: Secondary | ICD-10-CM | POA: Insufficient documentation

## 2011-07-15 DIAGNOSIS — J019 Acute sinusitis, unspecified: Secondary | ICD-10-CM

## 2011-07-15 MED ORDER — TRAMADOL HCL 50 MG PO TABS
50.0000 mg | ORAL_TABLET | Freq: Every day | ORAL | Status: DC
Start: 2011-07-15 — End: 2012-01-28

## 2011-07-15 MED ORDER — AZITHROMYCIN 250 MG PO TABS: ORAL_TABLET | ORAL | Status: AC

## 2011-07-15 NOTE — Assessment & Plan Note (Signed)
Encourage cessation. °

## 2011-07-15 NOTE — Patient Instructions (Signed)
You need to work on quitting smoking. Return when ankle bothering you to evaluate. You have a sinus infection. This could be viral however. Take medicine as prescribed if worsening or not improving as expected (zpack). Push fluids and plenty of rest. Nasal saline irrigation or neti pot to help drain sinuses. May use simple mucinex with plenty of fluid to help mobilize mucous. Let us know if fever >101.5, trouble opening/closing mouth, difficulty swallowing, or worsening - you may need to be seen again.

## 2011-07-15 NOTE — Progress Notes (Signed)
  Subjective:    Patient ID: Mathew Lamb, male    DOB: 1950-07-05, 61 y.o.   MRN: [id number]  HPI CC: cough  Feeling somewhat ill over last week (7 days).  + subjective fevers and chills.  Wonders if had 2 separate viruses.  First with stomach issues - nausea/diarrhea.  No vomiting.  Then respiratory issues - cough started 5 d ago.  Today actually feeling better.  Some headache more than congestion.  R ear stopped up.  RN.  Decreased appetite.  Has tried mucinex liquid as well as allergy meds.  Also took immodium.  No chest pain, tightness, SOB, wheezing, PNdrainage.  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  Wife sick recently - ST, sinus congestion.  Has taken all this week off work, has lost weight.  Smoking 2 ppd - decreased recently.  Never dx with COPD.  Lab Results  Component Value Date   HGBA1C 6.4 04/09/2011   Wt Readings from Last 3 Encounters:  07/15/11 209 lb 12 oz (95.142 kg)  04/09/11 216 lb 4 oz (98.09 kg)  03/26/11 215 lb (97.523 kg)    Review of Systems Per HPI    Objective:   Physical Exam  Nursing note and vitals reviewed. Constitutional: He appears well-developed and well-nourished. No distress.  HENT:  Head: Normocephalic and atraumatic.  Right Ear: Hearing, tympanic membrane, external ear and ear canal normal.  Left Ear: Hearing, tympanic membrane, external ear and ear canal normal.  Nose: Nose normal. No mucosal edema or rhinorrhea. Right sinus exhibits no maxillary sinus tenderness and no frontal sinus tenderness. Left sinus exhibits no maxillary sinus tenderness and no frontal sinus tenderness.  Mouth/Throat: Uvula is midline, oropharynx is clear and moist and mucous membranes are normal. No oropharyngeal exudate, posterior oropharyngeal edema, posterior oropharyngeal erythema or tonsillar abscesses.       congested  Eyes: Conjunctivae and EOM are normal.  Pupils are equal, round, and reactive to light. No scleral icterus.  Neck: Normal range of motion. Neck supple.  Cardiovascular: Normal rate, regular rhythm, normal heart sounds and intact distal pulses.   No murmur heard. Pulmonary/Chest: Effort normal and breath sounds normal. No respiratory distress. He has no wheezes. He has no rales.       Crackles bibasilarly Coarse from COPD  Musculoskeletal: He exhibits no edema.  Lymphadenopathy:    He has no cervical adenopathy.  Skin: Skin is warm and dry. No rash noted.  Psychiatric: He has a normal mood and affect.       Assessment & Plan:

## 2011-07-15 NOTE — Assessment & Plan Note (Addendum)
Anticipate initial viral infection (maybe flu) that has developed into sinusitis, however given 7d duration and improving, possibly just viral. However does have comorbidities that place him at risk for bacterial superinfection (smoking, DM). Supportive care as per instructions, fill zpack if not improved as expected (several abx allergies, has tolerated zpack in past). Red flags to return discussed.

## 2011-07-16 ENCOUNTER — Telehealth: Payer: Self-pay | Admitting: *Deleted

## 2011-07-16 MED ORDER — BENZONATATE 100 MG PO CAPS: 100.0000 mg | ORAL_CAPSULE | Freq: Two times a day (BID) | ORAL | Status: AC | PRN

## 2011-07-16 NOTE — Telephone Encounter (Signed)
Pt was seen yesterday and given antibiotic.  He was offered a cough medicine but declined and has now decided that he does need one.  He is asking that something be called to [facility].  He does have allergy to codeine.

## 2011-07-16 NOTE — Telephone Encounter (Signed)
Message left notifying patient that Rx had been sent in.

## 2011-07-16 NOTE — Telephone Encounter (Signed)
Will try tessalon perls - sent in.  Swallow, don't chew

## 2011-07-17 ENCOUNTER — Telehealth: Payer: Self-pay | Admitting: *Deleted

## 2011-07-17 NOTE — Telephone Encounter (Signed)
Noted. Thanks. Will see tomorrow.  

## 2011-07-17 NOTE — Telephone Encounter (Signed)
Triage Record Num: [id number] Operator: Lavone Nian Patient Name: Mathew Lamb Call Date & Time: 07/17/2011 1:32:15PM Patient Phone: [phone] PCP: Eustaquio Boyden Patient Gender: Male PCP Fax : [phone] Patient DOB: January 28, 1951 Practice Name: [facility] Reason for Call: Caller: Lisa/Spouse; PCP: Eustaquio Boyden; CB#: [phone]; ; ; Call regarding Cough/Congestion; onset 07/11/11; wife reports weight loss of 22lbs in 4 days; office visit 07/11/11 dx sinus infection and Rx Z-pack; wife reports onset of diarrhea after taking Z-pack on 07/11/11; 07/15/11 Rx Tessalon pearls for cough that has not given relief;07/17/11 productive cough with green sputum; coughing is continuous to the point of dry heaving; all emergent sxs r/o per Cough with exception of " new or worsening cough and known cardiac or respiratory condition"; contacted office for appt and advised by Lyla Son that no other appts available in office and to refer patient to UC/ED; instructions given to spouse and she states that patient is not willing to go to UC/ED because he has diarrhea and requested to have appt in office 07/18/11; transferred to office for appt 07/18/11. Protocol(s) Used: Cough - Adult Recommended Outcome per Protocol: See Provider within 4 hours Reason for Outcome: New or worsening cough AND known cardiac or respiratory condition Care Advice: ~ IMMEDIATE ACTION 07/17/2011 1:53:52PM Page 1 of 1 [email or web address]

## 2011-07-17 NOTE — Telephone Encounter (Signed)
Patient's wife called and said tessalon perls have been no help for patient's cough and now he is having extremely watery diarrhea also. She was wanting patient to be seen today for a recheck. I advised there was no available appointments today and that you had already left for the day. I advised he could go to [facility] if he was in need of being seen today. She refused and said he would not go due the diarrhea. I scheduled him an appt tomorrow and advised in the meantime that he could try delsym OTC and try the BRAT diet. I advised more clear fluids than food to avoid dehydration. She verbalized understanding.

## 2011-07-18 ENCOUNTER — Encounter: Payer: Self-pay | Admitting: Family Medicine

## 2011-07-18 ENCOUNTER — Ambulatory Visit (INDEPENDENT_AMBULATORY_CARE_PROVIDER_SITE_OTHER): Payer: Managed Care, Other (non HMO) | Admitting: Family Medicine

## 2011-07-18 VITALS — BP 116/74 | HR 70 | Temp 97.8°F | Wt 210.5 lb

## 2011-07-18 DIAGNOSIS — J40 Bronchitis, not specified as acute or chronic: Secondary | ICD-10-CM

## 2011-07-18 MED ORDER — HYDROCOD POLST-CHLORPHEN POLST 10-8 MG/5ML PO LQCR: 5.0000 mL | Freq: Every evening | ORAL | Status: DC | PRN

## 2011-07-18 NOTE — Patient Instructions (Signed)
I think you have residual bronchitis.  i'm glad congestion is better. Finish zpack. Take tussionex for cough. Update [place] if not improving as expected

## 2011-07-18 NOTE — Progress Notes (Signed)
  Subjective:    Patient ID: Mathew Lamb, male    DOB: October 05, 1950, 61 y.o.   MRN: [id number]  HPI CC: f/u, not better  Longstanding smoker.  See prior note.  Seen here 07/15/2011 with 7d h/o sinus congestion, cough.  dx viral sinusitis, however as did not improve after 2 days, filled zpack.  Dry cough - fits that lead to heaves.  States coughing for 30 min at a time.  Tessalon perls didn't help.  Several allergies/intolerances to narcotics and abx in past.  Dry cough.  Otherwise feels slowly improving.    No fevers/chills.  BM - loose stools but no frank diarrhea.  Took immodium.  Having bowel accidents 2/2 coughing.  Review of Systems Per HPI    Objective:   Physical Exam  Nursing note and vitals reviewed. Constitutional: He appears well-developed and well-nourished. No distress.  HENT:  Head: Normocephalic and atraumatic.  Nose: Right sinus exhibits no maxillary sinus tenderness and no frontal sinus tenderness. Left sinus exhibits no maxillary sinus tenderness and no frontal sinus tenderness.  Mouth/Throat: Uvula is midline, oropharynx is clear and moist and mucous membranes are normal. No oropharyngeal exudate, posterior oropharyngeal edema, posterior oropharyngeal erythema or tonsillar abscesses.  Eyes: Conjunctivae and EOM are normal. Pupils are equal, round, and reactive to light. No scleral icterus.  Neck: Normal range of motion. Neck supple.  Cardiovascular: Normal rate, regular rhythm, normal heart sounds and intact distal pulses.   No murmur heard. Pulmonary/Chest: Effort normal and breath sounds normal. No respiratory distress. He has no wheezes. He has no rales.       Harsh dry coughing fits No wheeze  Skin: Skin is warm and dry. No rash noted.  Psychiatric: He has a normal mood and affect.       Assessment & Plan:

## 2011-07-18 NOTE — Assessment & Plan Note (Signed)
Resolving sinusitis/bronchitis.  Continued cough. Treat with tussionex today. Considered steroid shot but no wheezing on exam, significant intolerances to meds in past. Overall improving.

## 2011-07-30 ENCOUNTER — Encounter: Payer: Self-pay | Admitting: Family Medicine

## 2011-07-30 ENCOUNTER — Ambulatory Visit (INDEPENDENT_AMBULATORY_CARE_PROVIDER_SITE_OTHER): Payer: Managed Care, Other (non HMO) | Admitting: Family Medicine

## 2011-07-30 VITALS — BP 112/66 | HR 67 | Temp 98.2°F | Wt 210.0 lb

## 2011-07-30 DIAGNOSIS — IMO0002 Reserved for concepts with insufficient information to code with codable children: Secondary | ICD-10-CM

## 2011-07-30 DIAGNOSIS — S39011A Strain of muscle, fascia and tendon of abdomen, initial encounter: Secondary | ICD-10-CM

## 2011-07-30 NOTE — Assessment & Plan Note (Signed)
Likely from/exacerbated by coughing prev.  Unlikely to be intraabdominal in origin.  Follow clinically, f/u prn.  Anatomy d/w pt.  Okay for outpatient f/u.

## 2011-07-30 NOTE — Progress Notes (Signed)
9 days ago, he was leaning over to pick something up.  Had pain near RUQ at that point.  He's had less pain overall since then, but still with episodic flares with bending over.  He's had some episodic numbness and a circular area of erythema to the R of midline on abd, near lateral rectus border but not in RUQ.  It may hurt more after a long day of driving, along with R sided back pain.    He'd had a bad cough prev but that is resolved.    H/o diet controlled DM. No h/o abd surgery.    Meds, vitals, and allergies reviewed.   ROS: See HPI.  Otherwise, noncontributory.  nad ncat rrr ctab Back not ttp in midline or paraspinal areas abd soft, not ttp except for upper abd just lateral to rectus.  Murphy neg.  No rebound.  Mild change in sensation in the area but not dermatomal.  No rash.  Pain with rectus and oblique testing

## 2011-07-30 NOTE — Patient Instructions (Signed)
Try to avoid motions that hurt and this should get better.  I don't think it's your gall bladder. Take care.

## 2012-01-28 ENCOUNTER — Ambulatory Visit (INDEPENDENT_AMBULATORY_CARE_PROVIDER_SITE_OTHER): Payer: Managed Care, Other (non HMO) | Admitting: Family Medicine

## 2012-01-28 ENCOUNTER — Encounter: Payer: Self-pay | Admitting: Family Medicine

## 2012-01-28 VITALS — BP 126/82 | HR 80 | Temp 97.8°F | Wt 218.8 lb

## 2012-01-28 DIAGNOSIS — M545 Low back pain, unspecified: Secondary | ICD-10-CM | POA: Insufficient documentation

## 2012-01-28 DIAGNOSIS — J3489 Other specified disorders of nose and nasal sinuses: Secondary | ICD-10-CM

## 2012-01-28 DIAGNOSIS — R0981 Nasal congestion: Secondary | ICD-10-CM | POA: Insufficient documentation

## 2012-01-28 LAB — POCT URINALYSIS DIPSTICK
Bilirubin, UA: NEGATIVE
Glucose, UA: NEGATIVE
Nitrite, UA: NEGATIVE
Protein, UA: NEGATIVE
Spec Grav, UA: 1.02
Urobilinogen, UA: 0.2
pH, UA: 6

## 2012-01-28 MED ORDER — DOXYCYCLINE HYCLATE 100 MG PO CAPS: 100.0000 mg | ORAL_CAPSULE | Freq: Two times a day (BID) | ORAL | Status: DC

## 2012-01-28 MED ORDER — NAPROXEN 500 MG PO TABS: ORAL_TABLET | ORAL | Status: DC

## 2012-01-28 MED ORDER — TRAMADOL HCL 50 MG PO TABS: 50.0000 mg | ORAL_TABLET | Freq: Every day | ORAL | Status: DC

## 2012-01-28 NOTE — Assessment & Plan Note (Signed)
Lumbar strain vs nephrolithiasis given microhematuria today. Treat with NSAID, straining urine. Given microhematuria, rtc 1 mo for rpt UA.  If hematuria remaining, refer to urology.

## 2012-01-28 NOTE — Assessment & Plan Note (Signed)
Could be developing sinusitis - see pt instructions.   rec trial of mucinex and nasal saline.  WASP for doxycycline provided today in case progressing to sinusitis in this chronic smoker. Also with component of TMJ dysfunction, discnssued use of NSAID and isometric jaw exercises.

## 2012-01-28 NOTE — Progress Notes (Signed)
  Subjective:    Patient ID: Mathew Lamb, male    DOB: May 12, 1950, 61 y.o.   MRN: [id number]  HPI CC: R earache and sinus pressure, LBP  3d ago started with R earache and sinus pressure and headache.  Pain in ear only when clenching jaw.  Significant congestion present.  Also 3d ago started having LBP.  Possibly return of kidney stone?  Drank plenty of water and back pain improved.  Mostly right sided, some radiation into groin.  Denies fevers/chills, coughing, abd pain, n/v, dysuria, urgency, hematuria, ST tooth pain, PNDrainage.  No sick contacts at home. Smokes 2 ppd.  Contemplative.  Wife quit with chantix. No h/o asthma.  No known h/o COPD.  Past Medical History  Diagnosis Date  . T2DM (type 2 diabetes mellitus) 2010  . History of Lyme disease 1990s  . Vitamin d deficiency   . Chronic low back pain   . History of prostatitis   . History of kidney stones ~2005    found on CT  . Smoker     Review of Systems Per HPI    Objective:   Physical Exam  Nursing note and vitals reviewed. Constitutional: He appears well-developed and well-nourished. No distress.  HENT:  Head: Normocephalic and atraumatic.  Right Ear: Hearing, tympanic membrane, external ear and ear canal normal.  Left Ear: Hearing, tympanic membrane, external ear and ear canal normal.  Nose: No mucosal edema or rhinorrhea. Right sinus exhibits maxillary sinus tenderness and frontal sinus tenderness. Left sinus exhibits maxillary sinus tenderness and frontal sinus tenderness.  Mouth/Throat: Uvula is midline, oropharynx is clear and moist and mucous membranes are normal. No oropharyngeal exudate, posterior oropharyngeal edema, posterior oropharyngeal erythema or tonsillar abscesses.       R TMJ discomfort with opening jaw  Eyes: Conjunctivae normal and EOM are normal. Pupils are equal, round, and reactive to light. No scleral icterus.  Neck: Normal range of motion. Neck supple.  Cardiovascular: Normal rate,  regular rhythm, normal heart sounds and intact distal pulses.   No murmur heard. Pulmonary/Chest: Effort normal and breath sounds normal. No respiratory distress. He has no wheezes. He has no rales.       Coarse breath sounds  Abdominal: Soft. Bowel sounds are normal. He exhibits no distension and no mass. There is no hepatosplenomegaly. There is no tenderness. There is no rebound, no guarding and no CVA tenderness.  Lymphadenopathy:    He has no cervical adenopathy.  Skin: Skin is warm and dry. No rash noted.       Assessment & Plan:

## 2012-01-28 NOTE — Patient Instructions (Addendum)
I wonder if you do have kidney stone - treat with naprosyn twice daily for next 5 days for pain and strain urine. Push plenty of fluid as you have been doing last few days. Let us know if not improving as expected. I also think you have some dysfunction of your temporomandibular joint - naprosyn should help with this - take with food. For sinus congestion - try nasal saline and simple mucinex - sent into pharmacy.  If not improving as expected, fill antibiotic to cover possibly developing sinusitis (doxycycline). Return in 1 month to recheck urinalysis to ensure blood resolved.  If remaining, I may want to set you up with urologist to evaluate other causes of blood in urine given your smoking history.

## 2012-02-19 ENCOUNTER — Ambulatory Visit (INDEPENDENT_AMBULATORY_CARE_PROVIDER_SITE_OTHER): Payer: Managed Care, Other (non HMO) | Admitting: Family Medicine

## 2012-02-19 ENCOUNTER — Encounter: Payer: Self-pay | Admitting: Family Medicine

## 2012-02-19 VITALS — BP 140/70 | HR 78 | Temp 98.1°F

## 2012-02-19 DIAGNOSIS — E119 Type 2 diabetes mellitus without complications: Secondary | ICD-10-CM

## 2012-02-19 DIAGNOSIS — R0789 Other chest pain: Secondary | ICD-10-CM | POA: Insufficient documentation

## 2012-02-19 DIAGNOSIS — F17200 Nicotine dependence, unspecified, uncomplicated: Secondary | ICD-10-CM

## 2012-02-19 DIAGNOSIS — R079 Chest pain, unspecified: Secondary | ICD-10-CM | POA: Insufficient documentation

## 2012-02-19 LAB — COMPREHENSIVE METABOLIC PANEL
ALT: 21 U/L (ref 0–53)
Albumin: 3.9 g/dL (ref 3.5–5.2)
CO2: 26 mEq/L (ref 19–32)
Calcium: 9.4 mg/dL (ref 8.4–10.5)
Chloride: 105 mEq/L (ref 96–112)
GFR: 96.25 mL/min (ref >60.00)
Potassium: 4.4 mEq/L (ref 3.5–5.1)
Sodium: 139 mEq/L (ref 135–145)
Total Protein: 7.3 g/dL (ref 6.0–8.3)

## 2012-02-19 LAB — CBC WITH DIFFERENTIAL/PLATELET
Basophils Absolute: 0.1 10*3/uL (ref 0.0–0.1)
Basophils Relative: 0.7 % (ref 0.0–3.0)
Eosinophils Absolute: 0.3 10*3/uL (ref 0.0–0.7)
MCHC: 32.9 g/dL (ref 30.0–36.0)
MCV: 93.2 fl (ref 78.0–100.0)
Monocytes Absolute: 0.8 10*3/uL (ref 0.1–1.0)
Neutrophils Relative %: 47.7 % (ref 43.0–77.0)
RBC: 4.78 Mil/uL (ref 4.22–5.81)
RDW: 13.6 % (ref 11.5–14.6)

## 2012-02-19 LAB — TROPONIN I: Troponin I: <0.3 ng/mL (ref <0.30)

## 2012-02-19 MED ORDER — GI COCKTAIL ~~LOC~~: 30.0000 mL | Freq: Once | ORAL | Status: DC

## 2012-02-19 MED ORDER — GI COCKTAIL ~~LOC~~
30.0000 mL | Freq: Once | ORAL | Status: AC
  Administered 2012-02-19: 30 mL via ORAL

## 2012-02-19 NOTE — Assessment & Plan Note (Signed)
Continue to encourage cessation. 

## 2012-02-19 NOTE — Assessment & Plan Note (Addendum)
Check A1c today.

## 2012-02-19 NOTE — Addendum Note (Signed)
Addended by: Eustaquio Boyden on: 02/19/2012 05:14 PM   Modules accepted: Orders

## 2012-02-19 NOTE — Progress Notes (Signed)
Subjective:    Patient ID: Mathew Lamb, male    DOB: Feb 06, 1951, 61 y.o.   MRN: [id number]  HPI CC: chest pain  Pleasant 61 yo 2ppd smoker with h/o diet controlled T2DM presents with several day h/o chest discomfort.  sxs started last Thursday (5d ago) felt like was having panic attack "like i was crawling out of my skin" associated with chest pain.  Took 1/2 of wife's xanax, improved sxs.  Over weekend did well, worked at part time job, no chest pain.  Yesterday felt normal, even went hunting - no chest pain with this, and was climbing trees etc.  This morning around 8am started having worsening left sided chest pain described as sharp with radiation of soreness down left arm and down left side.  Took 2 325mg  aspirin this morning.  Currently having some arm discomfort.  Endorses having arm pain for last few months.  Tried zantac which didn't really help.  Belching doesn't really help.  Having some burning in chest.  Denies pressure/tightness in chest, dyspnea, sweating, dizziness, HA.  Stressful job. Smoker - 2 ppd. No fmhx or personal hx CAD. Has had heartburn and GERD in past.  Has been treated with nexium in past but insurance wouldn't cover. Prior on aciphex and prilosec but these didn't work well.  Lab Results  Component Value Date   HGBA1C 6.4 04/09/2011   Lab Results  Component Value Date   CHOL 179 04/09/2011   HDL 36.80* 04/09/2011   LDLCALC 109* 04/09/2011   LDLDIRECT 133.5 02/23/2009   TRIG 164.0* 04/09/2011   CHOLHDL 5 04/09/2011   Medications and allergies reviewed and updated in chart.  Past histories reviewed and updated if relevant as below. Patient Active Problem List  Diagnosis  . LYME DISEASE  . DIABETES MELLITUS  . UNSPECIFIED VITAMIN D DEFICIENCY  . LOW BACK PAIN, CHRONIC  . DISTURBANCES OF SENSATION OF SMELL AND TASTE  . PROSTATITIS, HX OF  . Healthcare maintenance  . Calf pain  . Smoking  . Bronchitis  . Abdominal wall strain  . Sinus congestion    . LBP (low back pain)   Past Medical History  Diagnosis Date  . T2DM (type 2 diabetes mellitus) 2010  . History of Lyme disease 1990s  . Vitamin D deficiency   . Chronic low back pain   . History of prostatitis   . History of kidney stones ~2005    found on CT  . Smoker    Past Surgical History  Procedure Date  . Finger surgery     right second finger   History  Substance Use Topics  . Smoking status: Current Every Day Smoker -- 2.0 packs/day for 50 years    Types: Cigarettes  . Smokeless tobacco: Never Used  . Alcohol Use: No   Family History  Problem Relation Age of Onset  . Diabetes Mother   . Cirrhosis Mother   . Alcohol abuse Mother   . Pulmonary embolism Father   . Alcohol abuse Father   . Coronary artery disease Neg Hx   . Stroke Neg Hx   . Cancer Neg Hx    Allergies  Allergen Reactions  . Actos (Pioglitazone Hydrochloride)     Pt prefers not to take  . Cephalexin     REACTION: UNSPECIFIED  . Ciprofloxacin     REACTION: GI UPSET  . Codeine     REACTION: RASH  . Cyclobenzaprine Hcl     REACTION: HIVES  . Diclofenac  Sodium     REACTION: GI UPSET  . Meperidine Hcl     REACTION: SWELLING  . Metformin     REACTION: diarrhea  . Oxycodone Hcl     REACTION: SWELLING  . Oxycodone-Acetaminophen     REACTION: "knocks me out"  . Oxycodone-Aspirin     REACTION: SWELLING  . Penicillins     REACTION: SHORT OF BREATH  . Pentazocine Lactate     REACTION: UNSPECIFIED   Current Outpatient Prescriptions on File Prior to Visit  Medication Sig Dispense Refill  . naproxen (NAPROSYN) 500 MG tablet Take one po bid x 1 week then prn pain, take with food  60 tablet  0  . traMADol (ULTRAM) 50 MG tablet Take 1 tablet (50 mg total) by mouth daily.  30 tablet  1  . aspirin (BAYER ASPIRIN) 325 MG tablet Take 325 mg by mouth daily.           Review of Systems Per HPI    Objective:   Physical Exam  Nursing note and vitals reviewed. Constitutional: He appears  well-developed and well-nourished.       overweight  HENT:  Head: Normocephalic and atraumatic.  Mouth/Throat: Oropharynx is clear and moist. No oropharyngeal exudate.  Eyes: Conjunctivae normal and EOM are normal. Pupils are equal, round, and reactive to light. No scleral icterus.  Neck: Normal range of motion. Neck supple.  Cardiovascular: Normal rate, regular rhythm, normal heart sounds and intact distal pulses.   No murmur heard. Pulmonary/Chest: Effort normal and breath sounds normal. No respiratory distress. He has no wheezes. He has no rales. He exhibits no tenderness.       No reproducible chest wall pain + reproducible left axillary pain  Musculoskeletal: He exhibits no edema.  Lymphadenopathy:    He has no cervical adenopathy.  Skin: Skin is warm and dry. No rash noted.  Psychiatric: He has a normal mood and affect.       Assessment & Plan:

## 2012-02-19 NOTE — Assessment & Plan Note (Addendum)
Overall atypical chest pain. Risk factors include smoker and weight and h/o diabetes although always well controlled off meds. 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 Also sent home with nexium samples for 2 wks. Regardless, check stat TnI today, if normal, I do want to refer him to cards for further eval possible stress test for atypical chest pain in 61 yo smoker. Discussed red flags to seek ER evaluation. ddx includes GERD, MSK, anxiety.  If overall normal w/u, address anxiety.  EKG today - NSR rate 85, normal axis and intervals, no ST/T changes.  no old to compare.  one PAC GI cocktail today --> not much improvement.

## 2012-02-19 NOTE — Patient Instructions (Addendum)
EKG overall looking ok today. Start stretching left shoulder, rest left shoulder. Take nexium daily for next 2 weeks to see if any heartburn/reflux component to this pain. Pass by lab for blood work today. Pass by Marion's office for referral to heart doctor.

## 2012-02-19 NOTE — Addendum Note (Signed)
Addended by: Criselda Peaches B on: 02/19/2012 03:50 PM   Modules accepted: Orders

## 2012-02-20 ENCOUNTER — Telehealth: Payer: Self-pay | Admitting: Family Medicine

## 2012-02-20 NOTE — Telephone Encounter (Signed)
Caller: April/Pharamcist; Patient Name: Mathew Lamb; PCP: Eustaquio Boyden; Best Callback Phone Number: [phone]  Pharmacist/April calling from [facility] on [address] at [phone]. States she received Escript 02/19/12 for GI cocktail. Pharmacist is calling to verify dosing instructions and quantity. RN reviewed Epic Electronic Record. Order noted for: GI cocktail;  Each 30 ml dose contains:  Maalox 12 ml, Viscous Lidocaine 12 ml, Donnatal Elixir 6 ml; X 1 dose only. Pharmacist informed of above dosing instructions. RN also noted documentation: Loma Messing, RN 02/19/12 1320 02/19/12 1548 30 mL Oral   exp 04/08/2012 Given. PLEASE VERIFY IF GI COCKTAIL WAS ADMINISTERED IN OFFICE OR IF PHARMACY NEEDS TO FILL AN ADDITIONAL DOSE. PLEASE INFORM PHARMACIST AT [facility] AT [phone].

## 2012-02-20 NOTE — Telephone Encounter (Signed)
Spoke with Rob.  Disregard. This was done in office.

## 2012-02-21 ENCOUNTER — Telehealth: Payer: Self-pay

## 2012-02-21 NOTE — Telephone Encounter (Signed)
Pt left v/m with name and DOB only. Called pts home # left v/m for pt to call back.

## 2012-03-03 ENCOUNTER — Ambulatory Visit (INDEPENDENT_AMBULATORY_CARE_PROVIDER_SITE_OTHER): Payer: Managed Care, Other (non HMO) | Admitting: Cardiovascular Disease

## 2012-03-03 ENCOUNTER — Encounter: Payer: Self-pay | Admitting: Cardiovascular Disease

## 2012-03-03 VITALS — BP 140/78 | HR 76 | Ht 66.0 in | Wt 220.5 lb

## 2012-03-03 DIAGNOSIS — R079 Chest pain, unspecified: Secondary | ICD-10-CM

## 2012-03-03 DIAGNOSIS — R0789 Other chest pain: Secondary | ICD-10-CM

## 2012-03-03 NOTE — Patient Instructions (Addendum)
Your physician has requested that you have en exercise stress myoview. For further information please visit [email or web address]. Please follow instruction sheet, as given.   

## 2012-03-06 ENCOUNTER — Encounter: Payer: Self-pay | Admitting: Cardiovascular Disease

## 2012-03-06 DIAGNOSIS — R0789 Other chest pain: Secondary | ICD-10-CM | POA: Insufficient documentation

## 2012-03-06 NOTE — Progress Notes (Signed)
HPI  This is a pleasant Mathew Lamb who was referred by Dr. Sharen Hones for evaluation of chest pain. The patient has history of diet-controlled diabetes and prolonged history of tobacco use and obesity. He has no previous cardiac history. He started having chest pain a few weeks ago described as sharp and aching sensation in the left side in the shoulder area. This was associated with significant anxiety which improved after taking Xanax. Most of these episodes happen at rest. He did multiple physical activities after that with no exertional chest discomfort or increased dyspnea. He did not have heartburn. He reports prolonged history of exertional dyspnea which he always attributed to his smoking status. There is no family history of premature coronary artery disease. He is under stress at work. No recent cardiac evaluation.   Allergies  Allergen Reactions  . Actos (Pioglitazone Hydrochloride)     Pt prefers not to take  . Cephalexin     REACTION: UNSPECIFIED  . Ciprofloxacin     REACTION: GI UPSET  . Codeine     REACTION: RASH  . Cyclobenzaprine Hcl     REACTION: HIVES  . Diclofenac Sodium     REACTION: GI UPSET  . Meperidine Hcl     REACTION: SWELLING  . Metformin     REACTION: diarrhea  . Oxycodone Hcl     REACTION: SWELLING  . Oxycodone-Acetaminophen     REACTION: "knocks me out"  . Oxycodone-Aspirin     REACTION: SWELLING  . Penicillins     REACTION: SHORT OF BREATH  . Pentazocine Lactate     REACTION: UNSPECIFIED     Current Outpatient Prescriptions on File Prior to Visit  Medication Sig Dispense Refill  . aspirin (BAYER ASPIRIN) 325 MG tablet Take 325 mg by mouth daily.        . naproxen (NAPROSYN) 500 MG tablet Take one po bid x 1 week then prn pain, take with food  Mathew tablet  0  . traMADol (ULTRAM) 50 MG tablet Take 1 tablet (50 mg total) by mouth daily.  30 tablet  1     Past Medical History  Diagnosis Date  . T2DM (type 2 diabetes mellitus) 2010  .  History of Lyme disease 1990s  . Vitamin D deficiency   . Chronic low back pain   . History of prostatitis   . History of kidney stones ~2005    found on CT  . Smoker      Past Surgical History  Procedure Date  . Finger surgery     right second finger     Family History  Problem Relation Age of Onset  . Diabetes Mother   . Cirrhosis Mother   . Alcohol abuse Mother   . Pulmonary embolism Father   . Alcohol abuse Father   . Coronary artery disease Neg Hx   . Stroke Neg Hx   . Cancer Neg Hx      History   Social History  . Marital Status: Married    Spouse Name: N/A    Number of Children: 1  . Years of Education: N/A   Occupational History  . Truck driver    Social History Main Topics  . Smoking status: Current Every Day Smoker -- 2.0 packs/day for 50 years    Types: Cigarettes  . Smokeless tobacco: Never Used  . Alcohol Use: No  . Drug Use: No  . Sexually Active: Not on file   Other Topics Concern  .  Not on file   Social History Narrative   Caffeine: 3 sodas/day (diet Mt Dew)Lives with wife, 2 dogs3rd marriage x 10 years (grown children)Occupation: Truck DriverActivity: huntsDiet: daily fruits/vegetables, no fish, red meat daily     ROS Constitutional: Negative for fever, chills, diaphoresis, activity change, appetite change and fatigue.  HENT: Negative for hearing loss, nosebleeds, congestion, sore throat, facial swelling, drooling, trouble swallowing, neck pain, voice change, sinus pressure and tinnitus.  Eyes: Negative for photophobia, pain, discharge and visual disturbance.  Respiratory: Negative for apnea, cough and wheezing.  Cardiovascular: Negative for  palpitations and leg swelling.  Gastrointestinal: Negative for nausea, vomiting, abdominal pain, diarrhea, constipation, blood in stool and abdominal distention.  Genitourinary: Negative for dysuria, urgency, frequency, hematuria and decreased urine volume.  Musculoskeletal: Negative for myalgias,  back pain, joint swelling, arthralgias and gait problem.  Skin: Negative for color change, pallor, rash and wound.  Neurological: Negative for dizziness, tremors, seizures, syncope, speech difficulty, weakness, light-headedness, numbness and headaches.  Psychiatric/Behavioral: Negative for suicidal ideas, hallucinations, behavioral problems and agitation. The patient is not nervous/anxious.     PHYSICAL EXAM   BP 140/78  Pulse 76  Ht 5\' 6"  (1.676 m)  Wt 220 lb 8 oz (100.018 kg)  BMI 35.59 kg/m2 Constitutional: He is oriented to person, place, and time. He appears well-developed and well-nourished. No distress.  HENT: No nasal discharge.  Head: Normocephalic and atraumatic.  Eyes: Pupils are equal and round. Right eye exhibits no discharge. Left eye exhibits no discharge.  Neck: Normal range of motion. Neck supple. No JVD present. No thyromegaly present.  Cardiovascular: Normal rate, regular rhythm with premature beats, normal heart sounds and. Exam reveals no gallop and no friction rub. No murmur heard.  Pulmonary/Chest: Effort normal and breath sounds normal. No stridor. No respiratory distress. He has no wheezes. He has no rales. He exhibits no tenderness.  Abdominal: Soft. Bowel sounds are normal. He exhibits no distension. There is no tenderness. There is no rebound and no guarding.  Musculoskeletal: Normal range of motion. He exhibits no edema and no tenderness.  Neurological: He is alert and oriented to person, place, and time. Coordination normal.  Skin: Skin is warm and dry. No rash noted. He is not diaphoretic. No erythema. No pallor.  Psychiatric: He has a normal mood and affect. His behavior is normal. Judgment and thought content normal.       EKG: Sinus  Rhythm  - frequent multiform ectopic ventricular beats  # VECs = 2, # types 2 -  Nonspecific T-abnormality.   ABNORMAL    ASSESSMENT AND PLAN

## 2012-03-06 NOTE — Assessment & Plan Note (Signed)
The patient's chest pain is overall atypical and does not seem to be exertional. He does have significant exertional dyspnea but he is a smoker. He has multiple risk factors for coronary artery disease. Thus, I recommend further evaluation with a treadmill nuclear stress test. Due to frequent PVCs on baseline ECG, a treadmill stress test alone likely will not be interpretable. I discussed with the patient the importance of lifestyle changes in order to decrease the chance of future coronary artery disease and cardiovascular events. We discussed the importance of controlling risk factors, smoking cessation, healthy diet as well as regular exercise. I also explained to him that a normal stress test does not rule out atherosclerosis.

## 2012-03-09 NOTE — Telephone Encounter (Signed)
Tried x 2 this AM to reach pt without success. Letter mailed.

## 2012-03-10 ENCOUNTER — Ambulatory Visit: Payer: Self-pay | Admitting: Cardiovascular Disease

## 2012-03-10 DIAGNOSIS — R079 Chest pain, unspecified: Secondary | ICD-10-CM

## 2012-03-11 ENCOUNTER — Telehealth: Payer: Self-pay | Admitting: *Deleted

## 2012-03-11 ENCOUNTER — Encounter: Payer: Self-pay | Admitting: *Deleted

## 2012-03-11 NOTE — Telephone Encounter (Signed)
Message copied by Kendrick Fries on Wed Mar 11, 2012 10:35 AM ------      Message from: [facility], MELISSA E      Created: Wed Mar 11, 2012  8:23 AM      Regarding: FW: test                   ----- Message -----         From: Iran Ouch, MD         Sent: 03/10/2012   5:11 PM           To: Marcelle Overlie, RN      Subject: test                                                     Inform him that his stress test was normal.

## 2012-03-11 NOTE — Telephone Encounter (Signed)
Pt informed of normal stress results.

## 2012-03-27 ENCOUNTER — Other Ambulatory Visit: Payer: Self-pay | Admitting: Cardiovascular Disease

## 2012-03-27 DIAGNOSIS — R079 Chest pain, unspecified: Secondary | ICD-10-CM

## 2012-05-16 ENCOUNTER — Emergency Department: Payer: Self-pay | Admitting: Emergency Medicine

## 2012-05-16 LAB — URINALYSIS, COMPLETE
Bacteria: NONE SEEN
Bilirubin,UR: NEGATIVE
Blood: NEGATIVE
Glucose,UR: NEGATIVE mg/dL (ref 0–75)
Ketone: NEGATIVE
Nitrite: NEGATIVE
Ph: 6 (ref 4.5–8.0)
Protein: NEGATIVE
Specific Gravity: 1.009 (ref 1.003–1.030)
Squamous Epithelial: NONE SEEN
WBC UR: 1 /HPF (ref 0–5)

## 2012-06-04 ENCOUNTER — Ambulatory Visit: Payer: Self-pay | Admitting: Urology

## 2013-02-06 ENCOUNTER — Emergency Department (HOSPITAL_COMMUNITY)
Admission: EM | Admit: 2013-02-06 | Discharge: 2013-02-06 | Disposition: A | Discharge status: Home / Self Care | Payer: BC Managed Care – PPO | Attending: Emergency Medicine | Admitting: Emergency Medicine

## 2013-02-06 ENCOUNTER — Encounter (HOSPITAL_COMMUNITY): Payer: Self-pay | Admitting: *Deleted

## 2013-02-06 DIAGNOSIS — Y9389 Activity, other specified: Secondary | ICD-10-CM | POA: Insufficient documentation

## 2013-02-06 DIAGNOSIS — S61219A Laceration without foreign body of unspecified finger without damage to nail, initial encounter: Secondary | ICD-10-CM

## 2013-02-06 DIAGNOSIS — Z862 Personal history of diseases of the blood and blood-forming organs and certain disorders involving the immune mechanism: Secondary | ICD-10-CM | POA: Insufficient documentation

## 2013-02-06 DIAGNOSIS — Z8619 Personal history of other infectious and parasitic diseases: Secondary | ICD-10-CM | POA: Insufficient documentation

## 2013-02-06 DIAGNOSIS — Z87442 Personal history of urinary calculi: Secondary | ICD-10-CM | POA: Insufficient documentation

## 2013-02-06 DIAGNOSIS — Z87448 Personal history of other diseases of urinary system: Secondary | ICD-10-CM | POA: Insufficient documentation

## 2013-02-06 DIAGNOSIS — F17200 Nicotine dependence, unspecified, uncomplicated: Secondary | ICD-10-CM | POA: Insufficient documentation

## 2013-02-06 DIAGNOSIS — S61209A Unspecified open wound of unspecified finger without damage to nail, initial encounter: Secondary | ICD-10-CM | POA: Insufficient documentation

## 2013-02-06 DIAGNOSIS — Y929 Unspecified place or not applicable: Secondary | ICD-10-CM | POA: Insufficient documentation

## 2013-02-06 DIAGNOSIS — E119 Type 2 diabetes mellitus without complications: Secondary | ICD-10-CM | POA: Insufficient documentation

## 2013-02-06 DIAGNOSIS — Z8739 Personal history of other diseases of the musculoskeletal system and connective tissue: Secondary | ICD-10-CM | POA: Insufficient documentation

## 2013-02-06 DIAGNOSIS — Z8639 Personal history of other endocrine, nutritional and metabolic disease: Secondary | ICD-10-CM | POA: Insufficient documentation

## 2013-02-06 MED ORDER — TETANUS-DIPHTH-ACELL PERTUSSIS 5-2.5-18.5 LF-MCG/0.5 IM SUSP
0.5000 mL | Freq: Once | INTRAMUSCULAR | Status: AC
  Administered 2013-02-06: 0.5 mL via INTRAMUSCULAR
  Filled 2013-02-06: qty 0.5

## 2013-02-06 MED ORDER — OXYCODONE-ACETAMINOPHEN 5-325 MG PO TABS: 1.0000 | ORAL_TABLET | Freq: Four times a day (QID) | ORAL | Status: DC | PRN
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
## 2013-02-06 NOTE — ED Notes (Signed)
Pt states that he was sent from urgent care because they did not feel comfortable providing care with the pt's history of alergic reactions to antibiotics and pain medication.  Pt provided RN with copy of his X-rays sent from Urgent care, that was given to ED PA

## 2013-02-07 NOTE — ED Provider Notes (Signed)
Medical screening examination/treatment/procedure(s) were performed by non-physician practitioner and as supervising physician I was immediately available for consultation/collaboration.   Belen Zwahlen M Medford Staheli, MD 02/07/13 1240 

## 2013-02-13 ENCOUNTER — Encounter (HOSPITAL_COMMUNITY): Payer: Self-pay | Admitting: Emergency Medicine

## 2013-02-13 ENCOUNTER — Emergency Department (HOSPITAL_COMMUNITY)
Admission: EM | Admit: 2013-02-13 | Discharge: 2013-02-13 | Disposition: A | Discharge status: Home / Self Care | Payer: BC Managed Care – PPO | Attending: Emergency Medicine | Admitting: Emergency Medicine

## 2013-02-13 DIAGNOSIS — G8929 Other chronic pain: Secondary | ICD-10-CM | POA: Insufficient documentation

## 2013-02-13 DIAGNOSIS — Z880 Allergy status to penicillin: Secondary | ICD-10-CM | POA: Insufficient documentation

## 2013-02-13 DIAGNOSIS — Z4800 Encounter for change or removal of nonsurgical wound dressing: Secondary | ICD-10-CM | POA: Insufficient documentation

## 2013-02-13 DIAGNOSIS — Z8619 Personal history of other infectious and parasitic diseases: Secondary | ICD-10-CM | POA: Insufficient documentation

## 2013-02-13 DIAGNOSIS — Z862 Personal history of diseases of the blood and blood-forming organs and certain disorders involving the immune mechanism: Secondary | ICD-10-CM | POA: Insufficient documentation

## 2013-02-13 DIAGNOSIS — F17200 Nicotine dependence, unspecified, uncomplicated: Secondary | ICD-10-CM | POA: Insufficient documentation

## 2013-02-13 DIAGNOSIS — E119 Type 2 diabetes mellitus without complications: Secondary | ICD-10-CM | POA: Insufficient documentation

## 2013-02-13 DIAGNOSIS — Z87898 Personal history of other specified conditions: Secondary | ICD-10-CM | POA: Insufficient documentation

## 2013-02-13 DIAGNOSIS — Z5189 Encounter for other specified aftercare: Secondary | ICD-10-CM

## 2013-02-13 DIAGNOSIS — Z87442 Personal history of urinary calculi: Secondary | ICD-10-CM | POA: Insufficient documentation

## 2013-02-13 DIAGNOSIS — Z8639 Personal history of other endocrine, nutritional and metabolic disease: Secondary | ICD-10-CM | POA: Insufficient documentation

## 2013-02-13 NOTE — ED Provider Notes (Signed)
CSN: [id number]     Arrival date & time 02/13/13  1256 History   This chart was scribed for non-physician practionier, Westley Gambles, working with Flint Melter, MD by Clydene Laming, ED Scribe. This patient was seen in room TR10C/TR10C and the patient's care was started at [facility].   Chief Complaint  Patient presents with  . Wound Check    The history is provided by the patient. No language interpreter was used.   HPI Comments: AADITH RAUDENBUSH is a 62 y.o. male who presents to the Emergency Department requesting a wound check for a laceration of the right ring finger one week ago. Pt states wound is still stiff and swollen but seems to be healing well. Bleeding is controlled.   Past Medical History  Diagnosis Date  . T2DM (type 2 diabetes mellitus) 2010  . History of Lyme disease 1990s  . Vitamin D deficiency   . Chronic low back pain   . History of prostatitis   . History of kidney stones ~2005    found on CT  . Smoker    Past Surgical History  Procedure Laterality Date  . Finger surgery      right second finger   Family History  Problem Relation Age of Onset  . Diabetes Mother   . Cirrhosis Mother   . Alcohol abuse Mother   . Pulmonary embolism Father   . Alcohol abuse Father   . Coronary artery disease Neg Hx   . Stroke Neg Hx   . Cancer Neg Hx    History  Substance Use Topics  . Smoking status: Current Every Day Smoker -- 2.00 packs/day for 50 years    Types: Cigarettes  . Smokeless tobacco: Never Used  . Alcohol Use: No    Review of Systems  Constitutional: Negative for fever and chills.  Musculoskeletal: Positive for arthralgias and myalgias.  Skin: Positive for wound. Negative for color change.    Allergies  Actos; Cephalexin; Ciprofloxacin; Codeine; Cyclobenzaprine hcl; Diclofenac sodium; Meperidine hcl; Metformin; Oxycodone hcl; Oxycodone-acetaminophen; Oxycodone-aspirin; Penicillins; and Pentazocine lactate  Home Medications   Current Outpatient Rx   Name  Route  Sig  Dispense  Refill  . acetaminophen (TYLENOL) 500 MG tablet   Oral   Take 1,000 mg by mouth every 6 (six) hours as needed for pain.         Marland Kitchen OVER THE COUNTER MEDICATION   Topical   Apply 1 application topically daily. Antibiotic ointment--apply to wound on finger         . oxyCODONE-acetaminophen (PERCOCET/ROXICET) 5-325 MG per tablet   Oral   Take 1 tablet by mouth every 6 (six) hours as needed for pain.   6 tablet   0    Triage Vitals:BP 132/84  Pulse 87  Temp(Src) 97.8 F (36.6 C) (Oral)  Resp 16  Ht 5\' 6"  (1.676 m)  Wt 215 lb 2 oz (97.58 kg)  BMI 34.74 kg/m2  SpO2 95% Physical Exam  Nursing note and vitals reviewed. Constitutional: He appears well-developed and well-nourished.  HENT:  Head: Normocephalic and atraumatic.  Eyes: Conjunctivae are normal.  Neck: Normal range of motion. Neck supple.  Pulmonary/Chest: No respiratory distress.  Neurological: He is alert.  Skin: Skin is warm and dry.  Healing laceration to right ring finger. Patients with appropriate range of motion but states that his finger feels stiff. Wound would likely benefit from 3 more days of healing prior to suture removal. Concerned that given location and  movement of finger, partial wound dehiscence is a possibility if sutures removed at this point. Wound does appear to be healing well and appropriately without infection.  Psychiatric: He has a normal mood and affect.    ED Course  Procedures (including critical care time) DIAGNOSTIC STUDIES: Oxygen Saturation is 95% on RA, normal by my interpretation.    COORDINATION OF CARE: 3:27 PM- Discussed treatment plan with pt at bedside. Pt verbalized understanding and agreement with plan.   Labs Review Labs Reviewed - No data to display Imaging Review No results found.  EKG Interpretation   None      Vital signs reviewed and are as follows: Filed Vitals:   02/13/13 1532  BP: 148/68  Pulse: 80  Temp:   Resp: 18      MDM   1. Visit for wound check    Wound healing well, however given appearance, still need several days before I would remove the stitches. No infection. Patient to return in 3 days.   Renne Crigler, PA-C 02/13/13 2158

## 2013-02-13 NOTE — ED Notes (Signed)
Pt reports being here one week ago for laceration to right ring finger, is here for wound check and suture removal.

## 2013-02-14 NOTE — ED Provider Notes (Signed)
Stable  Flint Melter, MD 02/14/13 [id number]

## 2013-08-18 ENCOUNTER — Telehealth: Payer: Self-pay | Admitting: Family Medicine

## 2013-08-18 ENCOUNTER — Encounter: Payer: Self-pay | Admitting: Family Medicine

## 2013-08-18 ENCOUNTER — Ambulatory Visit (INDEPENDENT_AMBULATORY_CARE_PROVIDER_SITE_OTHER): Payer: BC Managed Care – PPO | Admitting: Family Medicine

## 2013-08-18 VITALS — BP 114/72 | HR 70 | Temp 98.1°F | Wt 214.5 lb

## 2013-08-18 DIAGNOSIS — E119 Type 2 diabetes mellitus without complications: Secondary | ICD-10-CM

## 2013-08-18 DIAGNOSIS — R5381 Other malaise: Secondary | ICD-10-CM

## 2013-08-18 DIAGNOSIS — R5383 Other fatigue: Secondary | ICD-10-CM

## 2013-08-18 LAB — CBC WITH DIFFERENTIAL/PLATELET
Basophils Absolute: 0.1 10*3/uL (ref 0.0–0.1)
Basophils Relative: 0.6 % (ref 0.0–3.0)
EOS ABS: 0.3 10*3/uL (ref 0.0–0.7)
Eosinophils Relative: 2.8 % (ref 0.0–5.0)
HCT: 45.9 % (ref 39.0–52.0)
Hemoglobin: 15.4 g/dL (ref 13.0–17.0)
Lymphocytes Relative: 41.6 % (ref 12.0–46.0)
Lymphs Abs: 3.8 10*3/uL (ref 0.7–4.0)
MCHC: 33.6 g/dL (ref 30.0–36.0)
MCV: 90.7 fl (ref 78.0–100.0)
MONO ABS: 0.6 10*3/uL (ref 0.1–1.0)
Monocytes Relative: 6.6 % (ref 3.0–12.0)
NEUTROS PCT: 48.4 % (ref 43.0–77.0)
Neutro Abs: 4.4 10*3/uL (ref 1.4–7.7)
PLATELETS: 248 10*3/uL (ref 150.0–400.0)
RBC: 5.06 Mil/uL (ref 4.22–5.81)
RDW: 13.6 % (ref 11.5–14.6)
WBC: 9.1 10*3/uL (ref 4.5–10.5)

## 2013-08-18 LAB — COMPREHENSIVE METABOLIC PANEL
ALBUMIN: 3.8 g/dL (ref 3.5–5.2)
ALK PHOS: 77 U/L (ref 39–117)
ALT: 31 U/L (ref 0–53)
AST: 24 U/L (ref 0–37)
BUN: 14 mg/dL (ref 6–23)
CO2: 26 mEq/L (ref 19–32)
Calcium: 9.3 mg/dL (ref 8.4–10.5)
Chloride: 104 mEq/L (ref 96–112)
Creatinine, Ser: 0.9 mg/dL (ref 0.4–1.5)
GFR: 95.77 mL/min (ref >60.00)
GLUCOSE: 130 mg/dL — AB (ref 70–99)
POTASSIUM: 4.1 meq/L (ref 3.5–5.1)
SODIUM: 136 meq/L (ref 135–145)
TOTAL PROTEIN: 7.1 g/dL (ref 6.0–8.3)
Total Bilirubin: 0.4 mg/dL (ref 0.3–1.2)

## 2013-08-18 LAB — HEMOGLOBIN A1C: HEMOGLOBIN A1C: 7.7 % — AB (ref 4.6–6.5)

## 2013-08-18 NOTE — Patient Instructions (Signed)
Go to the lab on the way out.  We'll contact you with your lab report. Rest and fluids in the meantime.  This may have just been a virus.  Take care.

## 2013-08-18 NOTE — Progress Notes (Signed)
Pre visit review using our clinic review tool, if applicable. No additional management support is needed unless otherwise documented below in the visit note.  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  Was really fatigued.  He got some sleep and felt a little better.  Then a few days later he had nocturnal cramps in his legs, some HA, low grade fever, and sweats/chills.  The HA continues.  The sweats/chills "broke" and didn't come back.  He was out of work Monday, was off Tuesday.   No recent tick bites.  HA resolves for about 4 hours with tylenol.    He's had a feeling of a pulled muscle on the R side of his chest today.  No sputum. No cough.  No BLE edema.  No syncope.  No presyncope.    He's had an oral lesion for years.  He has had it evaluated mult times in the past.  Smoker.  Doesn't want to quit.    He had triggering on the R 4th finger, now he can't fully flex the 4th digit.    Off med for DM2.   Meds, vitals, and allergies reviewed.   ROS: See HPI.  Otherwise, noncontributory.  GEN: nad, alert and oriented HEENT: mucous membranes moist, taurus in OP noted.   NECK: supple w/ single isolated tender node on L side of neck CV: rrr.  no murmur, R sided chest pain at the pec reproduced with resisted ROM PULM: ctab, no inc wob ABD: soft, +bs EXT: no edema SKIN: no acute rash

## 2013-08-18 NOTE — Telephone Encounter (Signed)
Relevant patient education mailed to patient.  

## 2013-08-19 DIAGNOSIS — R5381 Other malaise: Secondary | ICD-10-CM | POA: Insufficient documentation

## 2013-08-19 NOTE — Assessment & Plan Note (Signed)
See notes on labs. 

## 2013-08-19 NOTE — Assessment & Plan Note (Signed)
Nonspecific sx.  Likely a benign viral process.  Nontoxic.  See notes on labs.  CBC unremarkable.  Chest wall pain reproduceable. Would see if sx didn't self resolve in the near future.  F/u prn. Pt agrees.

## 2013-08-31 ENCOUNTER — Telehealth: Payer: Self-pay

## 2013-08-31 NOTE — Telephone Encounter (Signed)
Relevant patient education assigned to patient using Emmi. ° °

## 2013-11-23 ENCOUNTER — Telehealth: Payer: Self-pay | Admitting: Family Medicine

## 2013-11-23 NOTE — Telephone Encounter (Signed)
Diabetic Bundle. Pt needs diabetic follow up.  Left vm for pt to return call.

## 2014-10-08 ENCOUNTER — Emergency Department (HOSPITAL_BASED_OUTPATIENT_CLINIC_OR_DEPARTMENT_OTHER)
Admission: EM | Admit: 2014-10-08 | Discharge: 2014-10-08 | Disposition: A | Discharge status: Home / Self Care | Payer: BLUE CROSS/BLUE SHIELD | Attending: Emergency Medicine | Admitting: Emergency Medicine

## 2014-10-08 ENCOUNTER — Encounter (HOSPITAL_BASED_OUTPATIENT_CLINIC_OR_DEPARTMENT_OTHER): Payer: Self-pay | Admitting: *Deleted

## 2014-10-08 DIAGNOSIS — Z8619 Personal history of other infectious and parasitic diseases: Secondary | ICD-10-CM | POA: Diagnosis not present

## 2014-10-08 DIAGNOSIS — G8929 Other chronic pain: Secondary | ICD-10-CM | POA: Diagnosis not present

## 2014-10-08 DIAGNOSIS — R1084 Generalized abdominal pain: Secondary | ICD-10-CM | POA: Diagnosis not present

## 2014-10-08 DIAGNOSIS — Z880 Allergy status to penicillin: Secondary | ICD-10-CM | POA: Diagnosis not present

## 2014-10-08 DIAGNOSIS — Z720 Tobacco use: Secondary | ICD-10-CM | POA: Diagnosis not present

## 2014-10-08 DIAGNOSIS — E119 Type 2 diabetes mellitus without complications: Secondary | ICD-10-CM | POA: Insufficient documentation

## 2014-10-08 DIAGNOSIS — R109 Unspecified abdominal pain: Secondary | ICD-10-CM | POA: Diagnosis present

## 2014-10-08 DIAGNOSIS — Z87442 Personal history of urinary calculi: Secondary | ICD-10-CM | POA: Insufficient documentation

## 2014-10-08 LAB — CBC WITH DIFFERENTIAL/PLATELET
Basophils Absolute: 0 10*3/uL (ref 0.0–0.1)
Basophils Relative: 0 % (ref 0–1)
EOS ABS: 0.2 10*3/uL (ref 0.0–0.7)
Eosinophils Relative: 2 % (ref 0–5)
HEMATOCRIT: 46.4 % (ref 39.0–52.0)
Hemoglobin: 16.1 g/dL (ref 13.0–17.0)
LYMPHS PCT: 37 % (ref 12–46)
Lymphs Abs: 4.1 10*3/uL — ABNORMAL HIGH (ref 0.7–4.0)
MCH: 30.4 pg (ref 26.0–34.0)
MCHC: 34.7 g/dL (ref 30.0–36.0)
MCV: 87.7 fL (ref 78.0–100.0)
MONO ABS: 1 10*3/uL (ref 0.1–1.0)
Monocytes Relative: 9 % (ref 3–12)
NEUTROS ABS: 5.7 10*3/uL (ref 1.7–7.7)
NEUTROS PCT: 52 % (ref 43–77)
PLATELETS: 238 10*3/uL (ref 150–400)
RBC: 5.29 MIL/uL (ref 4.22–5.81)
RDW: 13.2 % (ref 11.5–15.5)
WBC: 11 10*3/uL — AB (ref 4.0–10.5)

## 2014-10-08 LAB — COMPREHENSIVE METABOLIC PANEL
ALK PHOS: 76 U/L (ref 38–126)
ALT: 16 U/L — ABNORMAL LOW (ref 17–63)
AST: 20 U/L (ref 15–41)
Albumin: 4.3 g/dL (ref 3.5–5.0)
Anion gap: 10 (ref 5–15)
BUN: 17 mg/dL (ref 6–20)
CALCIUM: 9.2 mg/dL (ref 8.9–10.3)
CHLORIDE: 102 mmol/L (ref 101–111)
CO2: 23 mmol/L (ref 22–32)
Creatinine, Ser: 0.7 mg/dL (ref 0.61–1.24)
GFR calc non Af Amer: >60 mL/min (ref >60)
Glucose, Bld: 202 mg/dL — ABNORMAL HIGH (ref 65–99)
Potassium: 4.3 mmol/L (ref 3.5–5.1)
SODIUM: 135 mmol/L (ref 135–145)
Total Bilirubin: 0.8 mg/dL (ref 0.3–1.2)
Total Protein: 7.7 g/dL (ref 6.5–8.1)

## 2014-10-08 LAB — URINALYSIS, ROUTINE W REFLEX MICROSCOPIC
GLUCOSE, UA: NEGATIVE mg/dL
KETONES UR: NEGATIVE mg/dL
Leukocytes, UA: NEGATIVE
NITRITE: NEGATIVE
PH: 6 (ref 5.0–8.0)
SPECIFIC GRAVITY, URINE: 1.02 (ref 1.005–1.030)
UROBILINOGEN UA: 0.2 mg/dL (ref 0.0–1.0)

## 2014-10-08 LAB — OCCULT BLOOD X 1 CARD TO LAB, STOOL: Fecal Occult Bld: NEGATIVE

## 2014-10-08 LAB — LIPASE, BLOOD: Lipase: 38 U/L (ref 22–51)

## 2014-10-08 LAB — URINE MICROSCOPIC-ADD ON

## 2014-10-08 MED ORDER — SUCRALFATE 1 G PO TABS
1.0000 g | ORAL_TABLET | Freq: Once | ORAL | Status: AC
  Administered 2014-10-08: 1 g via ORAL
  Filled 2014-10-08: qty 1

## 2014-10-08 MED ORDER — SUCRALFATE 1 GM/10ML PO SUSP: 1.0000 g | Freq: Three times a day (TID) | ORAL | Status: DC

## 2014-10-08 MED ORDER — GI COCKTAIL ~~LOC~~
30.0000 mL | Freq: Once | ORAL | Status: AC
  Administered 2014-10-08: 30 mL via ORAL
  Filled 2014-10-08: qty 30

## 2014-10-08 MED ORDER — GI COCKTAIL ~~LOC~~: 15.0000 mL | Freq: Three times a day (TID) | ORAL | Status: DC

## 2014-10-08 NOTE — ED Notes (Signed)
Per patient, most recent bowel movement was with formed stool.

## 2014-10-08 NOTE — ED Notes (Signed)
Increasing abdominal pain since Wednesday.  [facility] and given prilosec and had an xray.  Pain is in abdomen and in right flank (and he states that he feels that he has a "knot" in the area)  Pain and abdominal problems since being on antibiotics and ibuprofen for abscessed tooth.

## 2014-10-08 NOTE — ED Notes (Signed)
PA at bedside.

## 2014-10-08 NOTE — ED Provider Notes (Signed)
CSN: [id number]     Arrival date & time 10/08/14  1236 History   First MD Initiated Contact with Patient 10/08/14 1325     Chief Complaint  Patient presents with  . Abdominal Pain     (Consider location/radiation/quality/duration/timing/severity/associated sxs/prior Treatment) HPI   Mathew Lamb is a 64 y.o. male complaining of severe diffuse abdominal pain onset 5 days ago. Patient denies fever, chills, nausea, vomiting, change in bowel or bladder habits, [place], hematochezia. Patient was taking clindamycin, ibuprofen and acetaminophen for tooth abscess which he finished approximately one week ago. Patient was seen at urgent care at the onset of pain 5 days ago. He was started on Prilosec and had a abdominal x-ray which was negative. Patient states the pain is worse at night, states it keeps him up all night and he has not been able to sleep.  Past Medical History  Diagnosis Date  . T2DM (type 2 diabetes mellitus) 2010  . History of Lyme disease 1990s  . Vitamin D deficiency   . Chronic low back pain   . History of prostatitis   . History of kidney stones ~2005    found on CT  . Smoker    Past Surgical History  Procedure Laterality Date  . Finger surgery      right second finger   Family History  Problem Relation Age of Onset  . Diabetes Mother   . Cirrhosis Mother   . Alcohol abuse Mother   . Pulmonary embolism Father   . Alcohol abuse Father   . Coronary artery disease Neg Hx   . Stroke Neg Hx   . Cancer Neg Hx    History  Substance Use Topics  . Smoking status: Current Every Day Smoker -- 2.00 packs/day for 50 years    Types: Cigarettes  . Smokeless tobacco: Never Used  . Alcohol Use: No    Review of Systems  10 systems reviewed and found to be negative, except as noted in the HPI.  Allergies  Actos; Cephalexin; Ciprofloxacin; Codeine; Cyclobenzaprine hcl; Diclofenac sodium; Meperidine hcl; Metformin; Oxycodone hcl; Oxycodone-acetaminophen;  Oxycodone-aspirin; Penicillins; and Pentazocine lactate  Home Medications   Prior to Admission medications   Medication Sig Start Date End Date Taking? Authorizing Provider  acetaminophen (TYLENOL) 500 MG tablet Take 1,000 mg by mouth every 6 (six) hours as needed for pain.    Historical Provider, MD  Alum & Mag Hydroxide-Simeth (GI COCKTAIL) SUSP suspension Take 15 mLs by mouth 3 (three) times daily. Shake well. 10/08/14   Jenniefer Salak, PA-C  oxyCODONE-acetaminophen (PERCOCET/ROXICET) 5-325 MG per tablet Take 1 tablet by mouth every 6 (six) hours as needed for pain. 02/06/13   Junious Silk, PA-C  sucralfate (CARAFATE) 1 GM/10ML suspension Take 10 mLs (1 g total) by mouth 4 (four) times daily -  with meals and at bedtime. 10/08/14   Cameren Odwyer, PA-C   BP 138/72 mmHg  Pulse 85  Temp(Src) 98.5 F (36.9 C) (Oral)  Resp 18  Ht  (1.676 m)  Wt 206 lb (93.441 kg)  BMI 33.27 kg/m2  SpO2 94% Physical Exam  Constitutional: He is oriented to person, place, and time. He appears well-developed and well-nourished. No distress.  HENT:  Head: Normocephalic and atraumatic.  Mouth/Throat: Oropharynx is clear and moist.  Eyes: Conjunctivae and EOM are normal. Pupils are equal, round, and reactive to light.  Neck: Normal range of motion.  Cardiovascular: Normal rate, regular rhythm and intact distal pulses.   Pulmonary/Chest: Effort normal  and breath sounds normal.  Abdominal: Soft. Bowel sounds are normal. He exhibits no distension and no mass. There is no tenderness. There is no rebound and no guarding.  No TTP.   Murphy sign negative, no tenderness to palpation over McBurney's point, Rovsings, Psoas and obturator all negative.   Musculoskeletal: Normal range of motion.  Neurological: He is alert and oriented to person, place, and time.  Skin: He is not diaphoretic.  Psychiatric: He has a normal mood and affect.  Nursing note and vitals reviewed.   ED Course  Procedures (including  critical care time) Labs Review Labs Reviewed  URINALYSIS, ROUTINE W REFLEX MICROSCOPIC (NOT AT [facility]) - Abnormal; Notable for the following:    Hgb urine dipstick TRACE (*)    Bilirubin Urine SMALL (*)    Protein, ur TRACE (*)    All other components within normal limits  CBC WITH DIFFERENTIAL/PLATELET - Abnormal; Notable for the following:    WBC 11.0 (*)    Lymphs Abs 4.1 (*)    All other components within normal limits  COMPREHENSIVE METABOLIC PANEL - Abnormal; Notable for the following:    Glucose, Bld 202 (*)    ALT 16 (*)    All other components within normal limits  URINE MICROSCOPIC-ADD ON - Abnormal; Notable for the following:    Bacteria, UA FEW (*)    All other components within normal limits  LIPASE, BLOOD  OCCULT BLOOD X 1 CARD TO LAB, STOOL    Imaging Review No results found.   EKG Interpretation None      MDM   Final diagnoses:  Generalized abdominal pain    Filed Vitals:   10/08/14 1249 10/08/14 1259 10/08/14 1500  BP: 129/83  138/72  Pulse: 91  85  Temp: 98.5 F (36.9 C)    TempSrc: Oral    Resp: 20  18  Height: 5\' 6"  (1.676 m)    Weight: 215 lb (97.523 kg) 206 lb (93.441 kg)   SpO2: 98%  94%    Medications  sucralfate (CARAFATE) tablet 1 g (1 g Oral Given 10/08/14 1350)  gi cocktail (Maalox,Lidocaine,Donnatal) (30 mLs Oral Given 10/08/14 1351)    Mathew Lamb is a pleasant 64 y.o. male presenting with severe diffuse abdominal pain worsening at night. No systemic signs, no nausea or vomiting, no change in bowel or bladder habits. Abdominal exam is completely benign.   Urinalysis, blood work, guaiac negative. Carafate and GI cocktail does ease this patient's discomfort significantly. Advised him to continue to take Prilosec, follow-up with primary care and gastroenterology for possible endoscopy. Repeat abdominal exam remains completely benign.  Evaluation does not show pathology that would require ongoing emergent intervention or inpatient  treatment. Pt is hemodynamically stable and mentating appropriately. Discussed findings and plan with patient/guardian, who agrees with care plan. All questions answered. Return precautions discussed and outpatient follow up given.   New Prescriptions   ALUM & MAG HYDROXIDE-SIMETH (GI COCKTAIL) SUSP SUSPENSION    Take 15 mLs by mouth 3 (three) times daily. Shake well.   SUCRALFATE (CARAFATE) 1 GM/10ML SUSPENSION    Take 10 mLs (1 g total) by mouth 4 (four) times daily -  with meals and at bedtime.         Wynetta Emeryicole Talissa Apple, PA-C 10/08/14 1531  Gilda Creasehristopher J Pollina, MD 10/08/14 [phone]

## 2014-10-08 NOTE — ED Notes (Signed)
Attempt x 2 for piv placement unsuccessful- blood return obtained but unable to thread cath in right ac and right hand

## 2014-10-08 NOTE — Discharge Instructions (Signed)
Please follow with your primary care doctor in the next 2 days for a check-up. They must obtain records for further management.  ° °Do not hesitate to return to the Emergency Department for any new, worsening or concerning symptoms.  ° ° °Abdominal Pain °Many things can cause belly (abdominal) pain. Most times, the belly pain is not dangerous. Many cases of belly pain can be watched and treated at home. °HOME CARE  °· Do not take medicines that help you go poop (laxatives) unless told to by your doctor. °· Only take medicine as told by your doctor. °· Eat or drink as told by your doctor. Your doctor will tell you if you should be on a special diet. °GET HELP IF: °· You do not know what is causing your belly pain. °· You have belly pain while you are sick to your stomach (nauseous) or have runny poop (diarrhea). °· You have pain while you pee or poop. °· Your belly pain wakes you up at night. °· You have belly pain that gets worse or better when you eat. °· You have belly pain that gets worse when you eat fatty foods. °· You have a fever. °GET HELP RIGHT AWAY IF:  °· The pain does not go away within 2 hours. °· You keep throwing up (vomiting). °· The pain changes and is only in the right or left part of the belly. °· You have bloody or tarry looking poop. °MAKE SURE YOU:  °· Understand these instructions. °· Will watch your condition. °· Will get help right away if you are not doing well or get worse. °Document Released: 10/09/2007 Document Revised: 04/27/2013 Document Reviewed: 12/30/2012 °ExitCare® Patient Information ©2015 [employer]. This information is not intended to replace advice given to you by your health care provider. Make sure you discuss any questions you have with your health care provider. ° °

## 2014-10-10 ENCOUNTER — Encounter (HOSPITAL_COMMUNITY): Payer: Self-pay

## 2014-10-10 ENCOUNTER — Ambulatory Visit (INDEPENDENT_AMBULATORY_CARE_PROVIDER_SITE_OTHER): Payer: BLUE CROSS/BLUE SHIELD | Admitting: Internal Medicine

## 2014-10-10 ENCOUNTER — Ambulatory Visit (HOSPITAL_COMMUNITY)
Admission: RE | Admit: 2014-10-10 | Discharge: 2014-10-10 | Disposition: A | Discharge status: Home / Self Care | Payer: BLUE CROSS/BLUE SHIELD | Source: Ambulatory Visit | Attending: Internal Medicine | Admitting: Internal Medicine

## 2014-10-10 ENCOUNTER — Telehealth: Payer: Self-pay | Admitting: Family Medicine

## 2014-10-10 ENCOUNTER — Encounter: Payer: Self-pay | Admitting: Internal Medicine

## 2014-10-10 VITALS — BP 122/74 | HR 100 | Temp 98.4°F | Wt 206.0 lb

## 2014-10-10 DIAGNOSIS — R1084 Generalized abdominal pain: Secondary | ICD-10-CM

## 2014-10-10 DIAGNOSIS — K5790 Diverticulosis of intestine, part unspecified, without perforation or abscess without bleeding: Secondary | ICD-10-CM | POA: Insufficient documentation

## 2014-10-10 DIAGNOSIS — N200 Calculus of kidney: Secondary | ICD-10-CM

## 2014-10-10 MED ORDER — IOHEXOL 300 MG/ML  SOLN
100.0000 mL | Freq: Once | INTRAMUSCULAR | Status: AC | PRN
  Administered 2014-10-10: 100 mL via INTRAVENOUS

## 2014-10-10 MED ORDER — OXYCODONE-ACETAMINOPHEN 5-325 MG PO TABS: 1.0000 | ORAL_TABLET | Freq: Three times a day (TID) | ORAL | Status: DC | PRN

## 2014-10-10 NOTE — Patient Instructions (Signed)

## 2014-10-10 NOTE — Addendum Note (Signed)
Addended by: Alvina ChouWALSH, TERRI J on: 10/10/2014 04:29 PM   Modules accepted: Orders

## 2014-10-10 NOTE — Telephone Encounter (Signed)
Pt has appt with Nicki Reaperegina Baity NP on 10/10/14 at 4 PM.

## 2014-10-10 NOTE — Progress Notes (Signed)
Pre visit review using our clinic review tool, if applicable. No additional management support is needed unless otherwise documented below in the visit note. 

## 2014-10-10 NOTE — Progress Notes (Addendum)
Subjective:    Patient ID: Mathew Lamb, male    DOB: 28-Nov-1950, 64 y.o.   MRN: [id number]  HPI  Pt presents to the clinic today for ER follow up for abdominal. He reports his symptoms started a week and a half ago. He had bee taking Clindamycin for an abscessed tooth. The pain was so severe, that he went through a whole bottle of Ibuprofen in 1 weekend. He subsequently started having generalized abdominal pain that localized in the RUQ. He describes the pain as sharp and stabbing. It radiates through to his right back. He does have some associated nausea but no diarrhea or vomiting. The pain seems worse at night and after he eats. He is having normal BM and denies blood in his stool. He does have a history of kidney stones but reports this feel different. He denies urinary complaints. He went to [facility] 6/[facility]. They did a KUB which was normal by his report. They also started him on Prilosec but he reports it was not very effective. He gets better relief from his wife Pepcid 20 mg BID. His pain worsened on Friday night, so he went to [facility] ER. He had a slightly elevated WBC, but labs otherwise unremarkable. Hemoccult was negative. No imaging was done. He had good relief with GI cocktail, so they discharged him home with Simethicone and Carafate and advised him to follow up with his PCP. Since his discharge from the ER, he reports his pain has not improved. He can not eat and can not sleep. His wife reports 10 lb weight loss. They want to know what is going on.  Review of Systems      Past Medical History  Diagnosis Date  . T2DM (type 2 diabetes mellitus) 2010  . History of Lyme disease 1990s  . Vitamin D deficiency   . Chronic low back pain   . History of prostatitis   . History of kidney stones ~2005    found on CT  . Smoker     Current Outpatient Prescriptions  Medication Sig Dispense Refill  . acetaminophen (TYLENOL) 500 MG tablet Take 1,000 mg by mouth every 6 (six) hours as needed  for pain.    Marland Kitchen Alum & Mag Hydroxide-Simeth (GI COCKTAIL) SUSP suspension Take 15 mLs by mouth 3 (three) times daily. Shake well. 100 mL 0  . oxyCODONE-acetaminophen (PERCOCET/ROXICET) 5-325 MG per tablet Take 1 tablet by mouth every 6 (six) hours as needed for pain. 6 tablet 0  . sucralfate (CARAFATE) 1 GM/10ML suspension Take 10 mLs (1 g total) by mouth 4 (four) times daily -  with meals and at bedtime. 420 mL 0   No current facility-administered medications for this visit.    Allergies  Allergen Reactions  . Actos [Pioglitazone Hydrochloride]     Pt prefers not to take  . Cephalexin     REACTION: UNSPECIFIED  . Ciprofloxacin     REACTION: GI UPSET  . Codeine     REACTION: RASH  . Cyclobenzaprine Hcl     REACTION: HIVES  . Diclofenac Sodium     REACTION: GI UPSET  . Meperidine Hcl     REACTION: SWELLING  . Metformin     REACTION: diarrhea  . Oxycodone Hcl     REACTION: SWELLING  . Oxycodone-Acetaminophen     REACTION: "knocks me out"  . Oxycodone-Aspirin     REACTION: SWELLING  . Penicillins     REACTION: SHORT OF BREATH  . Pentazocine  Lactate     REACTION: UNSPECIFIED    Family History  Problem Relation Age of Onset  . Diabetes Mother   . Cirrhosis Mother   . Alcohol abuse Mother   . Pulmonary embolism Father   . Alcohol abuse Father   . Coronary artery disease Neg Hx   . Stroke Neg Hx   . Cancer Neg Hx     History   Social History  . Marital Status: Married    Spouse Name: N/A  . Number of Children: 1  . Years of Education: N/A   Occupational History  . Truck driver    Social History Main Topics  . Smoking status: Current Every Day Smoker -- 2.00 packs/day for 50 years    Types: Cigarettes  . Smokeless tobacco: Never Used  . Alcohol Use: No  . Drug Use: No  . Sexual Activity: Not on file   Other Topics Concern  . Not on file   Social History Narrative   Caffeine: 3 sodas/day (diet [address])   Lives with wife, 2 dogs   3rd marriage x 10 years  (grown children)   Occupation: Truck Hospital doctorDriver   Activity: hunts   Diet: daily fruits/vegetables, no fish, red meat daily     Constitutional: Denies fever, malaise, fatigue, headache or abrupt weight changes.  Respiratory: Denies difficulty breathing, shortness of breath, cough or sputum production.   Cardiovascular: Denies chest pain, chest tightness, palpitations or swelling in the hands or feet.  Gastrointestinal: Pt reports abdominal pain and nausea. Denies bloating, constipation, diarrhea or blood in the stool.  GU: Denies urgency, frequency, pain with urination, burning sensation, blood in urine, odor or discharge.  No other specific complaints in a complete review of systems (except as listed in HPI above).  Objective:   Physical Exam  BP 122/74 mmHg  Pulse 100  Temp(Src) 98.4 F (36.9 C) (Oral)  Wt 206 lb (93.441 kg)  SpO2 97% Wt Readings from Last 3 Encounters:  10/10/14 206 lb (93.441 kg)  10/08/14 206 lb (93.441 kg)  08/18/13 214 lb 8 oz (97.297 kg)    General: Appears his stated age, obese with 8 lb weight los over 2 months, in NAD.  Cardiovascular: Normal rate and rhythm. S1,S2 noted.  No murmur, rubs or gallops noted.  Pulmonary/Chest: Normal effort and positive vesicular breath sounds. No respiratory distress. No wheezes, rales or ronchi noted.  Abdomen: Distended but soft. Generally tender. Hypoactive bowel sounds, no bruits noted. No masses noted. Liver, spleen and kidneys non palpable. No CVA tenderness. :  BMET    Component Value Date/Time   NA 135 10/08/2014 1400   K 4.3 10/08/2014 1400   CL 102 10/08/2014 1400   CO2 23 10/08/2014 1400   GLUCOSE 202* 10/08/2014 1400   BUN 17 10/08/2014 1400   CREATININE 0.70 10/08/2014 1400   CALCIUM 9.2 10/08/2014 1400   GFRNONAA >60 10/08/2014 1400   GFRAA >60 10/08/2014 1400    Lipid Panel     Component Value Date/Time   CHOL 179 04/09/2011 1224   TRIG 164.0* 04/09/2011 1224   HDL 36.80* 04/09/2011 1224    CHOLHDL 5 04/09/2011 1224   VLDL 32.8 04/09/2011 1224   LDLCALC 109* 04/09/2011 1224    CBC    Component Value Date/Time   WBC 11.0* 10/08/2014 1400   RBC 5.29 10/08/2014 1400   HGB 16.1 10/08/2014 1400   HCT 46.4 10/08/2014 1400   PLT 238 10/08/2014 1400   MCV 87.7 10/08/2014  1400   MCH 30.4 10/08/2014 1400   MCHC 34.7 10/08/2014 1400   RDW 13.2 10/08/2014 1400   LYMPHSABS 4.1* 10/08/2014 1400   MONOABS 1.0 10/08/2014 1400   EOSABS 0.2 10/08/2014 1400   BASOSABS 0.0 10/08/2014 1400    Hgb A1C Lab Results  Component Value Date   HGBA1C 7.7* 08/18/2013        Assessment & Plan:   Generalized abdominal pain:  Given the amount of Ibuprofen he took, concerned about ulcer Will check H Pylori Advised him to continue Pepcid and Carafate for now until labs are back RX for Percocet 5-325 mg, # 10 only Given severity of pain, will get STAT CT abd to r/o infectious process, kidney stone or diverticulitis CT abd showed 8 mm left non obstructing kidney stone Will start Flomax, eRx provided and STAT referral to urology May need STAT referral to GI, will await lab results No Ibuprofen  Will follow up with you after labs are back

## 2014-10-10 NOTE — Telephone Encounter (Signed)
[facility] - Client TELEPHONE ADVICE RECORD [facility]  Patient Name: Anette RiedelRALPH Hentz  Gender: Male  DOB: 07/29/1950   Age: [age] Y 1 M 23 D  Return Phone Number: [phone](336) [phone] (Primary)  Address: [address]   City[place]State[place]: [place] [id number]   Client [facility] [facility]  [facility]  Physician Eustaquio BoydenGutierrez, Javier   Contact Type Call  Call Type Triage / Clinical  Caller Name Reesa  Relationship To Patient Spouse  Appointment Disposition EMR Appointment Scheduled  Info pasted into Epic Yes  Return Phone Number [phone]) [phone] (Primary)  Chief Complaint ABDOMINAL PAIN - Severe and only in abdomen  Initial Comment Caller states husband has been having severe ABD pain since Thursday, [facility] Saturday, gave a cocktail but nothing is working.   PreDisposition Call Doctor   Nurse Assessment  Nurse: Arnold LongMaples, RN, Trish Date/Time Lamount Cohen(Eastern Time): 10/10/2014 9:24:28 AM  Confirm and document reason for call. If symptomatic, describe symptoms. ---Wife is calling for caller states husband has been having severe ABD pain since Thursday, [facility] and went to ER on Saturday, gave a cocktail but nothing is working. Is recovering from an abscess tooth and just got off clindamyacin. Can't eat can't sleep. Went to ER and received something for pain. Wife thinks he has an ulcer. Stomach discomfort is making him unable to eat and sleep. Pain is on right front to back not same pain as when he gets kidney stones. Constant pain and worsens with eating and at night. No fever no vomiting and has more firm stools. He is having stools every day. Went to ER Saturday carafate 1 gm 10 ml at meal and bed time was given at ER. GI cocktail maylox and litacaine. More pain in right upper and lower. No blood in stool and had a rectal exam in ER.  Has the patient traveled  out of the country within the last 30 days? ---No  Does the patient require triage? ---Yes  Related visit to physician within the last 2 weeks? ---Yes  Does the PT have any chronic conditions? (i.e. diabetes, asthma, etc.) ---Yes  List chronic conditions. ---abscess tooth is better pain and infection subsided and they have to find an oral surgeon     Guidelines      Guideline Title Affirmed Question Affirmed Notes Nurse Date/Time Lamount Cohen(Eastern Time)  Abdominal Pain - Upper [1] MILD-MODERATE constant pain AND [2] present > 2 hours AND [3] not relieved by antacids  Arnold LongMaples, RN, Trish 10/10/2014 9:32:43 AM   Disp. Time Lamount Cohen(Eastern Time) Disposition Final User   10/10/2014 9:22:26 AM Send to Urgent [facility] Conners[facility]  Medley, Cristie HemDesiree    10/10/2014 9:36:07 AM See Physician within 4 Hours (or PCP triage) Yes Arnold LongMaples, RN, Les Pourish        Caller Understands: Yes  Disagree/Comply: Comply     Care Advice Given Per Guideline      SEE PHYSICIAN WITHIN 4 HOURS (or PCP triage): CALL BACK IF: * You become worse. CARE ADVICE given per Abdominal Pain, Upper (Adult) guideline.   After Care Instructions Given     Call Event Type User Date / Time Description        Comments  User: Izola Pricerish, Maples, RN Date/Time Lamount Cohen(Eastern Time): 10/10/2014 9:40:58 AM  Offered an appointment with NP at 11:00 and declined would like to see md. appointment  scheduled at 2:24 with Dr. Sampson Si   Referrals  REFERRED TO PCP OFFICE

## 2014-10-11 ENCOUNTER — Ambulatory Visit (HOSPITAL_COMMUNITY): Payer: BLUE CROSS/BLUE SHIELD

## 2014-10-11 ENCOUNTER — Other Ambulatory Visit: Payer: Self-pay | Admitting: Internal Medicine

## 2014-10-11 ENCOUNTER — Telehealth: Payer: Self-pay | Admitting: Internal Medicine

## 2014-10-11 DIAGNOSIS — R1084 Generalized abdominal pain: Secondary | ICD-10-CM

## 2014-10-11 LAB — H. PYLORI ANTIBODY, IGG: H Pylori IgG: NEGATIVE

## 2014-10-11 MED ORDER — TAMSULOSIN HCL 0.4 MG PO CAPS: 0.4000 mg | ORAL_CAPSULE | Freq: Every day | ORAL | Status: DC

## 2014-10-11 NOTE — Telephone Encounter (Signed)
Thank you, results are not back yet.

## 2014-10-11 NOTE — Addendum Note (Signed)
Addended by: Lorre MunroeBAITY, Abundio Teuscher W on: 10/11/2014 07:19 AM   Modules accepted: Orders

## 2014-10-11 NOTE — Telephone Encounter (Signed)
When speaking with pt this morning about urology referral he asked about blood work results of the blood that was collected yesterday at are office to rule out any possible ulcers? I told him someone would be in contact with him once results came back and were reviewed.

## 2014-10-12 ENCOUNTER — Telehealth: Payer: Self-pay | Admitting: Family Medicine

## 2014-10-12 ENCOUNTER — Other Ambulatory Visit: Payer: Self-pay | Admitting: Internal Medicine

## 2014-10-12 ENCOUNTER — Telehealth: Payer: Self-pay | Admitting: Internal Medicine

## 2014-10-12 ENCOUNTER — Telehealth: Payer: Self-pay

## 2014-10-12 MED ORDER — GI COCKTAIL ~~LOC~~: 15.0000 mL | Freq: Three times a day (TID) | ORAL | Status: DC

## 2014-10-12 NOTE — Telephone Encounter (Signed)
Pt came in late afternoon yesterday to pick up CT on disc. He is requesting the "cocktail" medicine that helps numb from his throat to his stomach. He states the hospital gave it to him and its the only thing so far that has helped with his pain. I also talked to him about his GI referral, he is going to call me after his urology appt today to see what Dr. Evelene CroonWolff says and let me know where he wants to go. Please advise.

## 2014-10-12 NOTE — Telephone Encounter (Signed)
[facility] cocktail [facility]

## 2014-10-12 NOTE — Telephone Encounter (Signed)
error 

## 2014-10-12 NOTE — Telephone Encounter (Signed)
They are aware as instructed

## 2014-10-12 NOTE — Telephone Encounter (Signed)
With lidocaine

## 2014-10-12 NOTE — Telephone Encounter (Signed)
Pt aware.

## 2014-10-12 NOTE — Telephone Encounter (Signed)
Pam [facility] left v/m requesting cb to clarify which alum & mag hydroxide & simeth does pt need; the med with lidocaine or carafate. Pam request cb.

## 2014-10-13 ENCOUNTER — Telehealth: Payer: Self-pay | Admitting: Internal Medicine

## 2014-10-13 NOTE — Telephone Encounter (Signed)
Verify he is taking Pepcid twice daily, and Carafate. If he is, will refill percocet for the weekend only.

## 2014-10-13 NOTE — Telephone Encounter (Signed)
Spoke to pt wife Reesa.  Pt is seeing Dorris Fetch, PA [facility]GI) on Tuesday June 14th at 830am. This is the soonest appt I could get after trying 3 different offices. Pt is aware of the appt but would like to know what else he can do for the pain? He is out of percocet and with the weekend coming up he does not want to go back to the ER b/c they didn't help him the 1st time. Pt wife stated, Urologist (Dr. Evelene Croon) does not think his kidney stone is causing the stomach pain. Can you refill the percocet? Pt has been out of work almost 2 weeks. Pt wife and pt both think he needs an endoscopy. Please advise.  Please call pt wife back at [phone]

## 2014-10-14 ENCOUNTER — Telehealth: Payer: Self-pay | Admitting: Internal Medicine

## 2014-10-14 NOTE — Telephone Encounter (Signed)
great

## 2014-10-14 NOTE — Telephone Encounter (Signed)
Noted. Pending EGD today at 12:45pm plz call this afternoon for update on how he's doing after EGD.

## 2014-10-14 NOTE — Telephone Encounter (Signed)
Spoke with wife. She didn't go with him to his appt and all she knew at this time was that he had ulcers and biopsies were taken. She said the final results should take 7-10 days to come back and she will let us know what they are.

## 2014-10-14 NOTE — Telephone Encounter (Signed)
Pt wife called to inform office that pt was seen yesterday by Dr. Loreta Ave at [facility]. He is scheduled to have and endoscopy today at 1245pm.

## 2014-10-18 ENCOUNTER — Ambulatory Visit: Payer: Self-pay | Admitting: Urgent Care

## 2014-10-19 ENCOUNTER — Encounter
Admission: RE | Admit: 2014-10-19 | Discharge: 2014-10-19 | Disposition: A | Discharge status: Home / Self Care | Payer: BLUE CROSS/BLUE SHIELD | Source: Ambulatory Visit | Attending: Urology | Admitting: Urology

## 2014-10-20 ENCOUNTER — Encounter
Admission: RE | Disposition: A | Discharge status: Home / Self Care | Payer: Self-pay | Source: Ambulatory Visit | Attending: Urology

## 2014-10-20 ENCOUNTER — Ambulatory Visit
Admission: RE | Admit: 2014-10-20 | Discharge: 2014-10-20 | Disposition: A | Discharge status: Home / Self Care | Payer: BLUE CROSS/BLUE SHIELD | Source: Ambulatory Visit | Attending: Urology | Admitting: Urology

## 2014-10-20 DIAGNOSIS — Z888 Allergy status to other drugs, medicaments and biological substances status: Secondary | ICD-10-CM | POA: Insufficient documentation

## 2014-10-20 DIAGNOSIS — Z881 Allergy status to other antibiotic agents status: Secondary | ICD-10-CM | POA: Diagnosis not present

## 2014-10-20 DIAGNOSIS — E119 Type 2 diabetes mellitus without complications: Secondary | ICD-10-CM | POA: Insufficient documentation

## 2014-10-20 DIAGNOSIS — Z79899 Other long term (current) drug therapy: Secondary | ICD-10-CM | POA: Diagnosis not present

## 2014-10-20 DIAGNOSIS — Z833 Family history of diabetes mellitus: Secondary | ICD-10-CM | POA: Insufficient documentation

## 2014-10-20 DIAGNOSIS — Z880 Allergy status to penicillin: Secondary | ICD-10-CM | POA: Diagnosis not present

## 2014-10-20 DIAGNOSIS — Z885 Allergy status to narcotic agent status: Secondary | ICD-10-CM | POA: Diagnosis not present

## 2014-10-20 DIAGNOSIS — F17200 Nicotine dependence, unspecified, uncomplicated: Secondary | ICD-10-CM | POA: Diagnosis not present

## 2014-10-20 DIAGNOSIS — N200 Calculus of kidney: Secondary | ICD-10-CM | POA: Diagnosis present

## 2014-10-20 HISTORY — PX: EXTRACORPOREAL SHOCK WAVE LITHOTRIPSY: SHX1557

## 2014-10-20 SURGERY — LITHOTRIPSY, ESWL
Anesthesia: Moderate Sedation | Laterality: Left

## 2014-10-20 MED ORDER — MIDAZOLAM HCL 2 MG/2ML IJ SOLN
INTRAMUSCULAR | Status: AC
  Administered 2014-10-20: 1 mg via INTRAMUSCULAR
  Filled 2014-10-20: qty 2

## 2014-10-20 MED ORDER — MORPHINE SULFATE 10 MG/ML IJ SOLN: 10.0000 mg | Freq: Once | INTRAMUSCULAR | Status: DC

## 2014-10-20 MED ORDER — ONDANSETRON 8 MG PO TBDP
8.0000 mg | ORAL_TABLET | Freq: Four times a day (QID) | ORAL | Status: DC | PRN
Start: 2014-10-20 — End: 2022-02-11

## 2014-10-20 MED ORDER — DIPHENHYDRAMINE HCL 25 MG PO CAPS
25.0000 mg | ORAL_CAPSULE | ORAL | Status: AC
  Administered 2014-10-20: 25 mg via ORAL

## 2014-10-20 MED ORDER — MORPHINE SULFATE 10 MG/ML IJ SOLN
INTRAMUSCULAR | Status: AC
  Filled 2014-10-20: qty 1

## 2014-10-20 MED ORDER — DEXTROSE-NACL 5-0.45 % IV SOLN
INTRAVENOUS | Status: DC
  Administered 2014-10-20: 07:00:00 via INTRAVENOUS

## 2014-10-20 MED ORDER — PROMETHAZINE HCL 25 MG/ML IJ SOLN
INTRAMUSCULAR | Status: AC
  Administered 2014-10-20: 25 mg via INTRAMUSCULAR
  Filled 2014-10-20: qty 1

## 2014-10-20 MED ORDER — PROMETHAZINE HCL 25 MG/ML IJ SOLN
25.0000 mg | Freq: Once | INTRAMUSCULAR | Status: AC
  Administered 2014-10-20: 25 mg via INTRAMUSCULAR

## 2014-10-20 MED ORDER — MIDAZOLAM HCL 2 MG/2ML IJ SOLN
1.0000 mg | Freq: Once | INTRAMUSCULAR | Status: AC
  Administered 2014-10-20: 1 mg via INTRAMUSCULAR

## 2014-10-20 MED ORDER — NUCYNTA 50 MG PO TABS: 50.0000 mg | ORAL_TABLET | Freq: Four times a day (QID) | ORAL | Status: DC | PRN

## 2014-10-20 MED ORDER — TAMSULOSIN HCL 0.4 MG PO CAPS: 0.4000 mg | ORAL_CAPSULE | Freq: Every day | ORAL | Status: AC

## 2014-10-20 MED ORDER — DIPHENHYDRAMINE HCL 25 MG PO CAPS
ORAL_CAPSULE | ORAL | Status: AC
  Administered 2014-10-20: 25 mg via ORAL
  Filled 2014-10-20: qty 1

## 2014-10-20 NOTE — Discharge Instructions (Signed)
Kidney Stones Kidney stones (urolithiasis) are solid masses that form inside your kidneys. The intense pain is caused by the stone moving through the kidney, ureter, bladder, and urethra (urinary tract). When the stone moves, the ureter starts to spasm around the stone. The stone is usually passed in your pee (urine).  HOME CARE  Drink enough fluids to keep your pee clear or pale yellow. This helps to get the stone out.  Strain all pee through the provided strainer. Do not pee without peeing through the strainer, not even once. If you pee the stone out, catch it in the strainer. The stone may be as small as a grain of salt. Take this to your doctor. This will help your doctor figure out what you can do to try to prevent more kidney stones.  Only take medicine as told by your doctor.  Follow up with your doctor as told.  Get follow-up X-rays as told by your doctor. GET HELP IF: You have pain that gets worse even if you have been taking pain medicine. GET HELP RIGHT AWAY IF:   Your pain does not get better with medicine.  You have a fever or shaking chills.  Your pain increases and gets worse over 18 hours.  You have new belly (abdominal) pain.  You feel faint or pass out.  You are unable to pee. MAKE SURE YOU:   Understand these instructions.  Will watch your condition.  Will get help right away if you are not doing well or get worse. Document Released: 10/09/2007 Document Revised: 12/23/2012 Document Reviewed: 09/23/2012 [facility] Patient Information 2015 [place], [place]. This information is not intended to replace advice given to you by your health care provider. Make sure you discuss any questions you have with your health care provider.  Kidney Stones Kidney stones (urolithiasis) are solid masses that form inside your kidneys. The intense pain is caused by the stone moving through the kidney, ureter, bladder, and urethra (urinary tract). When the stone moves, the ureter  starts to spasm around the stone. The stone is usually passed in your pee (urine).  HOME CARE  Drink enough fluids to keep your pee clear or pale yellow. This helps to get the stone out.  Strain all pee through the provided strainer. Do not pee without peeing through the strainer, not even once. If you pee the stone out, catch it in the strainer. The stone may be as small as a grain of salt. Take this to your doctor. This will help your doctor figure out what you can do to try to prevent more kidney stones.  Only take medicine as told by your doctor.  Follow up with your doctor as told.  Get follow-up X-rays as told by your doctor. GET HELP IF: You have pain that gets worse even if you have been taking pain medicine. GET HELP RIGHT AWAY IF:   Your pain does not get better with medicine.  You have a fever or shaking chills.  Your pain increases and gets worse over 18 hours.  You have new belly (abdominal) pain.  You feel faint or pass out.  You are unable to pee. MAKE SURE YOU:   Understand these instructions.  Will watch your condition.  Will get help right away if you are not doing well or get worse. Document Released: 10/09/2007 Document Revised: 12/23/2012 Document Reviewed: 09/23/2012 [facility] Patient Information 2015 [place], [place]. This information is not intended to replace advice given to you by your health care  provider. Make sure you discuss any questions you have with your health care provider.  Lithotripsy for Kidney Stones Lithotripsy is a treatment that can sometimes help eliminate kidney stones and pain that they cause. A form of lithotripsy, also known as extracorporeal shock wave lithotripsy, is a nonsurgical procedure that helps your body rid itself of the kidney stone when it is too big to pass on its own. Extracorporeal shock wave lithotripsy is a method of crushing a kidney stone with shock waves. These shock waves pass through your body and are focused on  your stone. They cause the kidney stones to crumble while still in the urinary tract. It is then easier for the smaller pieces of stone to pass in the urine. Lithotripsy usually takes about an hour. It is done in a hospital, a lithotripsy center, or a mobile unit. It usually does not require an overnight stay. Your health care provider will instruct you on preparation for the procedure. Your health care provider will tell you what to expect afterward. LET [facility] CARE PROVIDER KNOW ABOUT:  Any allergies you have.  All medicines you are taking, including vitamins, herbs, eye drops, creams, and over-the-counter medicines.  Previous problems you or members of your family have had with the use of anesthetics.  Any blood disorders you have.  Previous surgeries you have had.  Medical conditions you have. RISKS AND COMPLICATIONS Generally, lithotripsy for kidney stones is a safe procedure. However, as with any procedure, complications can occur. Possible complications include:  Infection.  Bleeding of the kidney.  Bruising of the kidney or skin.  Obstruction of the ureter.  Failure of the stone to fragment. BEFORE THE PROCEDURE  Do not eat or drink for 6-8 hours prior to the procedure. You may, however, take the medications with a sip of water that your physician instructs you to take  Do not take aspirin or aspirin-containing products for 7 days prior to your procedure  Do not take nonsteroidal anti-inflammatory products for 7 days prior to your procedure PROCEDURE A stent (flexible tube with holes) may be placed in your ureter. The ureter is the tube that transports the urine from the kidneys to the bladder. Your health care provider may place a stent before the procedure. This will help keep urine flowing from the kidney if the fragments of the stone block the ureter. You may have an IV tube placed in one of your veins to give you fluids and medicines. These medicines may help you  relax or make you sleep. During the procedure, you will lie comfortably on a fluid-filled cushion or in a warm-water bath. After an X-ray or ultrasound exam to locate your stone, shock waves are aimed at the stone. If you are awake, you may feel a tapping sensation as the shock waves pass through your body. If large stone particles remain after treatment, a second procedure may be necessary at a later date. For comfort during the test:  Relax as much as possible.  Try to remain still as much as possible.  Try to follow instructions to speed up the test.  Let your health care provider know if you are uncomfortable, anxious, or in pain. AFTER THE PROCEDURE  After surgery, you will be taken to the recovery area. A nurse will watch and check your progress. Once you're awake, stable, and taking fluids well, you will be allowed to go home as long as there are no problems. You will also be allowed to pass your  urine before discharge.You may be given antibiotics to help prevent infection. You may also be prescribed pain medicine if needed. In a week or two, your health care provider may remove your stent, if you have one. You may first have an X-ray exam to check on how successful the fragmentation of your stone has been and how much of the stone has passed. Your health care provider will check to see whether or not stone particles remain. SEEK IMMEDIATE MEDICAL CARE IF:  You develop a fever or shaking chills.  Your pain is not relieved by medicine.  You feel sick to your stomach (nauseated) and you vomit.  You develop heavy bleeding.  You have difficulty urinating.  You start to pass your stent from your penis. Document Released: 04/19/2000 Document Revised: 02/10/2013 Document Reviewed: 11/05/2012 [facility] Patient Information 2015 [place], [place]. This information is not intended to replace advice given to you by your health care provider. Make sure you discuss any questions you have with your  health care provider.  Kidney Stones Kidney stones (urolithiasis) are deposits that form inside your kidneys. The intense pain is caused by the stone moving through the urinary tract. When the stone moves, the ureter goes into spasm around the stone. The stone is usually passed in the urine.  CAUSES   A disorder that makes certain neck glands produce too much parathyroid hormone (primary hyperparathyroidism).  A buildup of uric acid crystals, similar to gout in your joints.  Narrowing (stricture) of the ureter.  A kidney obstruction present at birth (congenital obstruction).  Previous surgery on the kidney or ureters.  Numerous kidney infections. SYMPTOMS   Feeling sick to your stomach (nauseous).  Throwing up (vomiting).  Blood in the urine (hematuria).  Pain that usually spreads (radiates) to the groin.  Frequency or urgency of urination. DIAGNOSIS   Taking a history and physical exam.  Blood or urine tests.  CT scan.  Occasionally, an examination of the inside of the urinary bladder (cystoscopy) is performed. TREATMENT   Observation.  Increasing your fluid intake.  Extracorporeal shock wave lithotripsy--This is a noninvasive procedure that uses shock waves to break up kidney stones.  Surgery may be needed if you have severe pain or persistent obstruction. There are various surgical procedures. Most of the procedures are performed with the use of small instruments. Only small incisions are needed to accommodate these instruments, so recovery time is minimized. The size, location, and chemical composition are all important variables that will determine the proper choice of action for you. Talk to your health care provider to better understand your situation so that you will minimize the risk of injury to yourself and your kidney.  HOME CARE INSTRUCTIONS   Drink enough water and fluids to keep your urine clear or pale yellow. This will help you to pass the stone or  stone fragments.  Strain all urine through the provided strainer. Keep all particulate matter and stones for your health care provider to see. The stone causing the pain may be as small as a grain of salt. It is very important to use the strainer each and every time you pass your urine. The collection of your stone will allow your health care provider to analyze it and verify that a stone has actually passed. The stone analysis will often identify what you can do to reduce the incidence of recurrences.  Only take over-the-counter or prescription medicines for pain, discomfort, or fever as directed by your health care provider.  Make a follow-up appointment with your health care provider as directed.  Get follow-up X-rays if required. The absence of pain does not always mean that the stone has passed. It may have only stopped moving. If the urine remains completely obstructed, it can cause loss of kidney function or even complete destruction of the kidney. It is your responsibility to make sure X-rays and follow-ups are completed. Ultrasounds of the kidney can show blockages and the status of the kidney. Ultrasounds are not associated with any radiation and can be performed easily in a matter of minutes. SEEK MEDICAL CARE IF:  You experience pain that is progressive and unresponsive to any pain medicine you have been prescribed. SEEK IMMEDIATE MEDICAL CARE IF:   Pain cannot be controlled with the prescribed medicine.  You have a fever or shaking chills.  The severity or intensity of pain increases over 18 hours and is not relieved by pain medicine.  You develop a new onset of abdominal pain.  You feel faint or pass out.  You are unable to urinate. MAKE SURE YOU:   Understand these instructions.  Will watch your condition.  Will get help right away if you are not doing well or get worse. Document Released: 04/22/2005 Document Revised: 12/23/2012 Document Reviewed: 09/23/2012 [facility]  Patient Information 2015 [place], [place]. This information is not intended to replace advice given to you by your health care provider. Make sure you discuss any questions you have with your health care provider.

## 2014-10-27 ENCOUNTER — Telehealth: Payer: Self-pay | Admitting: Family Medicine

## 2014-10-27 NOTE — Telephone Encounter (Signed)
Stopped working 10/06/14 and returning 10/31/14. He did have 2 ulcers that were bleeding. Records requested.

## 2014-10-27 NOTE — Telephone Encounter (Signed)
Filled and in Kim's box. 

## 2014-10-27 NOTE — Telephone Encounter (Signed)
Received STD paperwork to fill out ofr patient. Need more info - plz get EGD results (done a few weeks ago by Dr Loreta Ave) and ask patient - when did he stop working and when did he restart working or when planned to restart?

## 2014-10-28 NOTE — Telephone Encounter (Signed)
Faxed to [employer] and copy mailed to patient.

## 2014-12-31 ENCOUNTER — Ambulatory Visit (INDEPENDENT_AMBULATORY_CARE_PROVIDER_SITE_OTHER): Payer: BLUE CROSS/BLUE SHIELD | Admitting: Physician Assistant

## 2014-12-31 VITALS — BP 132/80 | HR 95 | Temp 98.8°F | Resp 17 | Ht 66.5 in | Wt 207.0 lb

## 2014-12-31 DIAGNOSIS — K0521 Aggressive periodontitis, localized: Secondary | ICD-10-CM | POA: Diagnosis not present

## 2014-12-31 DIAGNOSIS — K05219 Aggressive periodontitis, localized, unspecified severity: Secondary | ICD-10-CM

## 2014-12-31 MED ORDER — CLINDAMYCIN HCL 150 MG PO CAPS: 150.0000 mg | ORAL_CAPSULE | Freq: Four times a day (QID) | ORAL | Status: DC

## 2014-12-31 NOTE — Progress Notes (Signed)
   Subjective:    Patient ID: Mathew Lamb, male    DOB: [birth year], 64 y.o.   MRN: [id number]  Chief Complaint  Patient presents with  . Dental Injury   Medications, allergies, past medical history, surgical history, family history, social history and problem list reviewed and updated.  HPI  26 yom present with tooth abscess.   Originally had approx 4 months ago. Unfortunately at that time he started taking ibuprofen for pain and had a gi bleed. He then had kidney stones and did not work for one month. He was never able to see an oral Careers adviser. He finally was able to see the ortho surgeon last week who is planning to see him in 4 days for an operation. He is here today as surgeon's office told him he should be on clinda prior to the procedure as pt does have murmur.   Denies fevers, chills.   Review of Systems See HPI     Objective:   Physical Exam  Constitutional: He is oriented to person, place, and time.  BP 132/80 mmHg  Pulse 95  Temp(Src) 98.8 F (37.1 C) (Oral)  Resp 17  Ht 5' 6.5" (1.689 m)  Wt 207 lb (93.895 kg)  BMI 32.91 kg/m2  SpO2 98%   HENT:  Mouth/Throat: Dental abscesses and dental caries present.    Small periodontal abscess near left upper 1st molar. Multiple caries. Very mild left sided facial swelling. No warmth or erythema.   Neurological: He is alert and oriented to person, place, and time.      Assessment & Plan:   Periodontal abscess - Plan: clindamycin (CLEOCIN) 150 MG capsule --small abscess in clinic, vitals stable --clinda 150 qid per surgeons office request, he will be seeing them in 4 days --rtc as needed  Donnajean Lopes, PA-C Physician Assistant-Certified [facility]  12/31/2014 3:01 PM

## 2014-12-31 NOTE — Patient Instructions (Signed)
Please take the clindamycin 150 mg 4 times daily until you see the oral surgeon.

## 2015-01-13 ENCOUNTER — Encounter: Payer: Self-pay | Admitting: Urology

## 2015-09-05 ENCOUNTER — Telehealth: Payer: Self-pay | Admitting: Family Medicine

## 2015-09-05 NOTE — Telephone Encounter (Signed)
Pt has appt with Dr Reece AgarG on 09/06/15 at 10:15.

## 2015-09-05 NOTE — Telephone Encounter (Signed)
Patient Name: Mathew Lamb  DOB: [birth year]    Initial Comment Caller states husband became ill yesterday morning; low grade temp 99.7 last night; diarrhea, cold chills, ankles, wrists, neck and joints aching; BS 256 fasting w/water only this morning; history of tick fever, w/o spots; has not found a tick; dx w/diabetes few years ago and then rechecked and said not diabetes; has not been on meds for diabetes;    Nurse Assessment  Nurse: Scarlette ArStandifer, RN, Heather Date/Time (Eastern Time): 09/05/2015 1:05:53 PM  Confirm and document reason for call. If symptomatic, describe symptoms. You must click the next button to save text entered. ---Caller states husband became ill yesterday morning; low grade temp 99.7 last night; diarrhea, cold chills, ankles, wrists, neck and joints aching; BS 256 fasting w/water only this morning; history of tick fever, w/o spots; has not found a tick; dx w/diabetes few years ago and then rechecked and said not diabetes; has not been on meds for diabetes;  Has the patient traveled out of the country within the last 30 days? ---Not Applicable  Does the patient have any new or worsening symptoms? ---Yes  Will a triage be completed? ---Yes  Related visit to physician within the last 2 weeks? ---No  Does the PT have any chronic conditions? (i.e. diabetes, asthma, etc.) ---Yes  List chronic conditions. ---See MR  Is this a behavioral health or substance abuse call? ---No     Guidelines    Guideline Title Affirmed Question Affirmed Notes  Diarrhea MILD-MODERATE diarrhea (e.g., 1-6 times / day more than normal) (all triage questions negative)    Final Disposition User   See Physician within 24 Hours Standifer, RN, Research scientist (physical sciences)Heather    Comments  Appt with Dr. Reece AgarG tomorrow at 10:15 am.  Caller states that her husband feels like he might have tick fever again. History of Lyme Disease in the 1990's. Also his blood sugar has been running in the 200's for a while, he is not on any diabetic  medication at this time.   Referrals  REFERRED TO PCP OFFICE   Disagree/Comply: Comply

## 2015-09-06 ENCOUNTER — Encounter: Payer: Self-pay | Admitting: Family Medicine

## 2015-09-06 ENCOUNTER — Ambulatory Visit (INDEPENDENT_AMBULATORY_CARE_PROVIDER_SITE_OTHER): Payer: BLUE CROSS/BLUE SHIELD | Admitting: Family Medicine

## 2015-09-06 VITALS — BP 130/80 | HR 106 | Temp 98.3°F | Wt 202.0 lb

## 2015-09-06 DIAGNOSIS — R6883 Chills (without fever): Secondary | ICD-10-CM | POA: Diagnosis not present

## 2015-09-06 DIAGNOSIS — IMO0001 Reserved for inherently not codable concepts without codable children: Secondary | ICD-10-CM

## 2015-09-06 DIAGNOSIS — E1165 Type 2 diabetes mellitus with hyperglycemia: Secondary | ICD-10-CM

## 2015-09-06 DIAGNOSIS — R5381 Other malaise: Secondary | ICD-10-CM

## 2015-09-06 LAB — POC URINALSYSI DIPSTICK (AUTOMATED)
Blood, UA: NEGATIVE
Leukocytes, UA: NEGATIVE
Nitrite, UA: NEGATIVE
Spec Grav, UA: >=1.03
Urobilinogen, UA: 0.2
pH, UA: 6

## 2015-09-06 LAB — POC INFLUENZA A&B (BINAX/QUICKVUE)
Influenza A, POC: NEGATIVE
Influenza B, POC: NEGATIVE

## 2015-09-06 MED ORDER — ONDANSETRON HCL 4 MG PO TABS: 4.0000 mg | ORAL_TABLET | Freq: Three times a day (TID) | ORAL | Status: DC | PRN

## 2015-09-06 NOTE — Progress Notes (Signed)
BP 130/80 mmHg  Pulse 106  Temp(Src) 98.3 F (36.8 C) (Oral)  Wt 202 lb (91.627 kg)  SpO2 97%   CC: not feeling well  Subjective:    Patient ID: Mathew Lamb, male    DOB: January 28, 1951, 65 y.o.   MRN: [id number]  HPI: Mathew Lamb is a 65 y.o. male presenting on 09/06/2015 for Nausea   Last seen by me 02/2012. Monday morning felt fine at work (15 hour work days, works 3 days a week) - started feeling nauseated, joint pains, neck pain, sharp stabbing pain at left hand. Got home and went straight to bed. + cold chills (did not check temperature), hives throughout body (resolved with benadryl), dry mouth with cough. + fecal incontinence. Today feeling better but noticed some hoarseness. No appetite.   No vomiting, diarrhea, other new rashes, no sick contacts.  Treating with tylenol Q8 hours. No new foods or travel.   H/o lyme disease 19 yrs ago - this feels similar.  No recent tick bites. He has been outside working in yard.  No mosquito bites.   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  Pt has been checking sugars - 200s.   Relevant past medical, surgical, family and social history reviewed and updated as indicated. Interim medical history since our last visit reviewed. Allergies and medications reviewed and updated. Current Outpatient Prescriptions on File Prior to Visit  Medication Sig  . acetaminophen (TYLENOL) 500 MG tablet Take 1,000 mg by mouth every 6 (six) hours as needed for pain. Reported on 09/06/2015  . Alum & Mag Hydroxide-Simeth (GI COCKTAIL) SUSP suspension Take 15 mLs by mouth 3 (three) times daily. Shake well. (Patient not taking: Reported on 12/31/2014)  . NUCYNTA 50 MG TABS tablet Take 1 tablet (50 mg total) by mouth every 6 (six) hours as needed. 1 TO 2 TABS Q 6 HOURS PRN PAIN (Patient not taking: Reported on 12/31/2014)  . ondansetron (ZOFRAN ODT) 8 MG disintegrating tablet Take 1 tablet (8 mg total) by mouth every 6 (six) hours as needed for  nausea or vomiting. (Patient not taking: Reported on 12/31/2014)  . oxyCODONE-acetaminophen (PERCOCET) 10-325 MG per tablet Take 1 tablet by mouth every 4 (four) hours as needed for pain. Reported on 09/06/2015  . sucralfate (CARAFATE) 1 GM/10ML suspension Take 10 mLs (1 g total) by mouth 4 (four) times daily -  with meals and at bedtime. (Patient not taking: Reported on 12/31/2014)  . tamsulosin (FLOMAX) 0.4 MG CAPS capsule Take 1 capsule (0.4 mg total) by mouth daily. (Patient not taking: Reported on 09/06/2015)   No current facility-administered medications on file prior to visit.    Review of Systems Per HPI unless specifically indicated in ROS section     Objective:    BP 130/80 mmHg  Pulse 106  Temp(Src) 98.3 F (36.8 C) (Oral)  Wt 202 lb (91.627 kg)  SpO2 97%  Wt Readings from Last 3 Encounters:  09/06/15 202 lb (91.627 kg)  12/31/14 207 lb (93.895 kg)  10/10/14 206 lb (93.441 kg)    Physical Exam  Constitutional: He appears well-developed and well-nourished. No distress.  HENT:  Head: Normocephalic and atraumatic.  Mouth/Throat: Oropharynx is clear and moist. No oropharyngeal exudate.  Eyes: Conjunctivae and EOM are normal. Pupils are equal, round, and reactive to light. No scleral icterus.  Neck: Normal range of motion. Neck supple. No thyromegaly present.  Cardiovascular: Normal rate, regular rhythm, normal heart sounds and intact distal pulses.  No murmur heard. Pulmonary/Chest: Effort normal and breath sounds normal. No respiratory distress. He has no wheezes. He has no rales.  Abdominal: Soft. Normal appearance and bowel sounds are normal. He exhibits no distension and no mass. There is no hepatosplenomegaly. There is no tenderness. There is no rigidity, no rebound, no guarding, no CVA tenderness and negative Murphy's sign.  Musculoskeletal: He exhibits no edema.  Lymphadenopathy:    He has no cervical adenopathy.  Skin: Skin is warm and dry. No rash noted.    Psychiatric: He has a normal mood and affect.  Nursing note and vitals reviewed.  Results for orders placed or performed in visit on 09/06/15  POCT Urinalysis Dipstick (Automated)  Result Value Ref Range   Color, UA Amber    Clarity, UA Hazy    Glucose, UA 2+    Bilirubin, UA 1+    Ketones, UA 2+    Spec Grav, UA >=1.030    Blood, UA Negative    pH, UA 6.0    Protein, UA 1+    Urobilinogen, UA 0.2    Nitrite, UA Negative    Leukocytes, UA Negative Negative  POC Influenza A&B(BINAX/QUICKVUE)  Result Value Ref Range   Influenza A, POC Negative Negative   Influenza B, POC Negative Negative      Assessment & Plan:   Problem List Items Addressed This Visit    Diabetes type 2, uncontrolled (HCC)    Discussed importance of close f/u for further eval/labs.      Malaise - Primary    3d duration of illness with malaise, body aches, chills with nausea and hives. Overall nonspecific symptoms. Flu swab negative, UA with signs of dehydration and glucosuria but no blood or LE.  No fever, no tick bites, no rash points against tick borne illness. Anticipate viral illness. Supportive care discussed - tylenol, fluids, rest, zofran prn nausea. Recommended further labwork to r/o other inflammatory condition - pt declines. Red flags to seek further care with labs discussed including fever >101, new rash, worsening productive cough or worsening malaise. Pt agrees with plan.      Relevant Orders   POCT Urinalysis Dipstick (Automated) (Completed)   POC Influenza A&B(BINAX/QUICKVUE) (Completed)    Other Visit Diagnoses    Chills        Relevant Orders    POCT Urinalysis Dipstick (Automated) (Completed)    POC Influenza A&B(BINAX/QUICKVUE) (Completed)        Follow up plan: Return in about 3 months (around 12/07/2015), or if symptoms worsen or fail to improve.  Eustaquio BoydenJavier Kilian Schwartz, MD

## 2015-09-06 NOTE — Assessment & Plan Note (Signed)
Discussed importance of close f/u for further eval/labs.

## 2015-09-06 NOTE — Patient Instructions (Addendum)
Check urine today and flu swab - negative.  Continue tylenol  three times daily.  Push fluids as urine shows ongoing dehydration.  Return in next 1-2 months for follow up visit with labs and we will see if we need to start diabetes medicine.  If any worsening, return for labs or seek urgent care.  For nausea may take zofran - sent to pharmacy.   Diabetes Mellitus and Food It is important for you to manage your blood sugar (glucose) level. Your blood glucose level can be greatly affected by what you eat. Eating healthier foods in the appropriate amounts throughout the day at about the same time each day will help you control your blood glucose level. It can also help slow or prevent worsening of your diabetes mellitus. Healthy eating may even help you improve the level of your blood pressure and reach or maintain a healthy weight.  General recommendations for healthful eating and cooking habits include:  Eating meals and snacks regularly. Avoid going long periods of time without eating to lose weight.  Eating a diet that consists mainly of plant-based foods, such as fruits, vegetables, nuts, legumes, and whole grains.  Using low-heat cooking methods, such as baking, instead of high-heat cooking methods, such as deep frying. Work with your dietitian to make sure you understand how to use the Nutrition Facts information on food labels. HOW CAN FOOD AFFECT ME? Carbohydrates Carbohydrates affect your blood glucose level more than any other type of food. Your dietitian will help you determine how many carbohydrates to eat at each meal and teach you how to count carbohydrates. Counting carbohydrates is important to keep your blood glucose at a healthy level, especially if you are using insulin or taking certain medicines for diabetes mellitus. Alcohol Alcohol can cause sudden decreases in blood glucose (hypoglycemia), especially if you use insulin or take certain medicines for diabetes mellitus.  Hypoglycemia can be a life-threatening condition. Symptoms of hypoglycemia (sleepiness, dizziness, and disorientation) are similar to symptoms of having too much alcohol.  If your health care provider has given you approval to drink alcohol, do so in moderation and use the following guidelines:  Women should not have more than one drink per day, and men should not have more than two drinks per day. One drink is equal to:  12 oz of beer.  5 oz of wine.  1 oz of hard liquor.  Do not drink on an empty stomach.  Keep yourself hydrated. Have water, diet soda, or unsweetened iced tea.  Regular soda, juice, and other mixers might contain a lot of carbohydrates and should be counted. WHAT FOODS ARE NOT RECOMMENDED? As you make food choices, it is important to remember that all foods are not the same. Some foods have fewer nutrients per serving than other foods, even though they might have the same number of calories or carbohydrates. It is difficult to get your body what it needs when you eat foods with fewer nutrients. Examples of foods that you should avoid that are high in calories and carbohydrates but low in nutrients include:  Trans fats (most processed foods list trans fats on the Nutrition Facts label).  Regular soda.  Juice.  Candy.  Sweets, such as cake, pie, doughnuts, and cookies.  Fried foods. WHAT FOODS CAN I EAT? Eat nutrient-rich foods, which will nourish your body and keep you healthy. The food you should eat also will depend on several factors, including:  The calories you need.  The medicines you  take.  Your weight.  Your blood glucose level.  Your blood pressure level.  Your cholesterol level. You should eat a variety of foods, including:  Protein.  Lean cuts of meat.  Proteins low in saturated fats, such as fish, egg whites, and beans. Avoid processed meats.  Fruits and vegetables.  Fruits and vegetables that may help control blood glucose levels,  such as apples, mangoes, and yams.  Dairy products.  Choose fat-free or low-fat dairy products, such as milk, yogurt, and cheese.  Grains, bread, pasta, and rice.  Choose whole grain products, such as multigrain bread, whole oats, and brown rice. These foods may help control blood pressure.  Fats.  Foods containing healthful fats, such as nuts, avocado, olive oil, canola oil, and fish. DOES EVERYONE WITH DIABETES MELLITUS HAVE THE SAME MEAL PLAN? Because every person with diabetes mellitus is different, there is not one meal plan that works for everyone. It is very important that you meet with a dietitian who will help you create a meal plan that is just right for you.   This information is not intended to replace advice given to you by your health care provider. Make sure you discuss any questions you have with your health care provider.   Document Released: 01/17/2005 Document Revised: 05/13/2014 Document Reviewed: 03/19/2013 Elsevier Interactive Patient Education [employer]2016 [employer] Inc.

## 2015-09-06 NOTE — Progress Notes (Signed)
Pre visit review using our clinic review tool, if applicable. No additional management support is needed unless otherwise documented below in the visit note. 

## 2015-09-06 NOTE — Assessment & Plan Note (Addendum)
3d duration of illness with malaise, body aches, chills with nausea and hives. Overall nonspecific symptoms. Flu swab negative, UA with signs of dehydration and glucosuria but no blood or LE.  No fever, no tick bites, no rash points against tick borne illness. Anticipate viral illness. Supportive care discussed - tylenol, fluids, rest, zofran prn nausea. Recommended further labwork to r/o other inflammatory condition - pt declines. Red flags to seek further care with labs discussed including fever >101, new rash, worsening productive cough or worsening malaise. Pt agrees with plan.

## 2015-11-30 ENCOUNTER — Telehealth: Payer: Self-pay | Admitting: Family Medicine

## 2015-11-30 NOTE — Telephone Encounter (Signed)
Patient Name: Mathew Lamb  DOB: March 27, 1951    Initial Comment Caller states c/o large infected sore on back of leg.   Nurse Assessment  Nurse: Stefano Gaul, RN, Dwana Curd Date/Time (Eastern Time): 11/30/2015 2:54:18 PM  Confirm and document reason for call. If symptomatic, describe symptoms. You must click the next button to save text entered. ---Caller states he has a large infected boil under his buttocks on the left side. Has a lot of swelling. Having pain when he tries to walk. he has soaked in a hot tub and sat in epsom salts. Noticed it on Monday.  Has the patient traveled out of the country within the last 30 days? ---Not Applicable  Does the patient have any new or worsening symptoms? ---Yes  Will a triage be completed? ---Yes  Related visit to physician within the last 2 weeks? ---No  Does the PT have any chronic conditions? (i.e. diabetes, asthma, etc.) ---No  Is this a behavioral health or substance abuse call? ---No     Guidelines    Guideline Title Affirmed Question Affirmed Notes  Boil (Skin Abscess) SEVERE pain (e.g., excruciating)    Final Disposition User   See Physician within 4 Hours (or PCP triage) Stefano Gaul, RN, Vera    Comments  Called secondary number and left message. Will try primary number.  Called primary number and left message. Will try secondary number in a few min  No appts available within 4 hrs at [facility]. Pt does not want to go to urgent care. He would like appt in the am. Please call pt back regarding appt.   Referrals  GO TO FACILITY REFUSED   Disagree/Comply: Comply

## 2015-11-30 NOTE — Telephone Encounter (Signed)
Pt scheduled appt on 12/01/15 at 2:30 with R Baity NP.

## 2015-12-01 ENCOUNTER — Encounter: Payer: Self-pay | Admitting: Internal Medicine

## 2015-12-01 ENCOUNTER — Ambulatory Visit (INDEPENDENT_AMBULATORY_CARE_PROVIDER_SITE_OTHER): Payer: BLUE CROSS/BLUE SHIELD | Admitting: Internal Medicine

## 2015-12-01 VITALS — BP 150/86 | HR 102 | Temp 98.9°F | Wt 201.0 lb

## 2015-12-01 DIAGNOSIS — L02416 Cutaneous abscess of left lower limb: Secondary | ICD-10-CM

## 2015-12-01 MED ORDER — HYDROCODONE-ACETAMINOPHEN 5-325 MG PO TABS: 1.0000 | ORAL_TABLET | Freq: Four times a day (QID) | ORAL | 0 refills | Status: DC | PRN

## 2015-12-01 MED ORDER — SULFAMETHOXAZOLE-TRIMETHOPRIM 400-80 MG PO TABS: 1.0000 | ORAL_TABLET | Freq: Two times a day (BID) | ORAL | 0 refills | Status: DC

## 2015-12-01 MED ORDER — OXYCODONE-ACETAMINOPHEN 5-325 MG PO TABS: 1.0000 | ORAL_TABLET | Freq: Three times a day (TID) | ORAL | 0 refills | Status: DC | PRN

## 2015-12-01 NOTE — Patient Instructions (Signed)

## 2015-12-01 NOTE — Progress Notes (Signed)
Pre visit review using our clinic review tool, if applicable. No additional management support is needed unless otherwise documented below in the visit note. 

## 2015-12-01 NOTE — Progress Notes (Addendum)
Subjective:    Patient ID: Mathew Lamb, male    DOB: 1950-11-01, 65 y.o.   MRN: [id number]  HPI  Pt presents to the clinic today with c/o a boil on the back of his upper left leg/lower buttock. He noticed this 5 days ago. The area is swollen and very painful. He has not noticed any drainage from the area. He denies fever, chills or body aches. He has tried to lance the area with a pin and a knife but was not successful. He has soaked in a warm tub with Epsom Salt with minimal relief. He does have a history of DM 2, last A1C was 7.7%.  Review of Systems  Past Medical History:  Diagnosis Date  . Allergy   . Anxiety   . Arthritis   . Chronic low back pain   . GERD (gastroesophageal reflux disease)   . Heart murmur   . History of kidney stones ~2005   found on CT  . History of Lyme disease 1990s  . History of prostatitis   . Smoker   . T2DM (type 2 diabetes mellitus) (HCC) 2010  . Vitamin D deficiency     Current Outpatient Prescriptions  Medication Sig Dispense Refill  . acetaminophen (TYLENOL) 500 MG tablet Take 1,000 mg by mouth every 6 (six) hours as needed for pain. Reported on 09/06/2015    . Alum & Mag Hydroxide-Simeth (GI COCKTAIL) SUSP suspension Take 15 mLs by mouth 3 (three) times daily. Shake well. (Patient not taking: Reported on 12/31/2014) 240 mL 0  . NUCYNTA 50 MG TABS tablet Take 1 tablet (50 mg total) by mouth every 6 (six) hours as needed. 1 TO 2 TABS Q 6 HOURS PRN PAIN (Patient not taking: Reported on 12/31/2014) 30 tablet 0  . ondansetron (ZOFRAN ODT) 8 MG disintegrating tablet Take 1 tablet (8 mg total) by mouth every 6 (six) hours as needed for nausea or vomiting. (Patient not taking: Reported on 12/31/2014) 10 tablet 3  . ondansetron (ZOFRAN) 4 MG tablet Take 1 tablet (4 mg total) by mouth every 8 (eight) hours as needed for nausea or vomiting. 20 tablet 0  . oxyCODONE-acetaminophen (PERCOCET) 10-325 MG per tablet Take 1 tablet by mouth every 4 (four) hours as  needed for pain. Reported on 09/06/2015    . sucralfate (CARAFATE) 1 GM/10ML suspension Take 10 mLs (1 g total) by mouth 4 (four) times daily -  with meals and at bedtime. (Patient not taking: Reported on 12/31/2014) 420 mL 0  . tamsulosin (FLOMAX) 0.4 MG CAPS capsule Take 1 capsule (0.4 mg total) by mouth daily. (Patient not taking: Reported on 09/06/2015) 30 capsule 11   No current facility-administered medications for this visit.     Allergies  Allergen Reactions  . Actos [Pioglitazone Hydrochloride]     Insomnia due to nocturia  . Cephalexin     REACTION: UNSPECIFIED  . Ciprofloxacin     REACTION: GI UPSET  . Codeine     REACTION: RASH  . Cyclobenzaprine Hcl     REACTION: HIVES  . Diclofenac Sodium     REACTION: GI UPSET  . Hydrocodone Swelling    Throat swells   . Meperidine Hcl     REACTION: SWELLING  . Metformin Diarrhea  . Oxycodone Hcl     REACTION: SWELLING  . Oxycodone-Aspirin     REACTION: SWELLING  . Penicillins     REACTION: SHORT OF BREATH  . Pentazocine Lactate     REACTION:  UNSPECIFIED    Family History  Problem Relation Age of Onset  . Diabetes Mother   . Cirrhosis Mother   . Alcohol abuse Mother   . Pulmonary embolism Father   . Alcohol abuse Father   . Coronary artery disease Neg Hx   . Stroke Neg Hx   . Cancer Neg Hx     Social History   Social History  . Marital status: Married    Spouse name: N/A  . Number of children: 1  . Years of education: N/A   Occupational History  . Truck driver    Social History Main Topics  . Smoking status: Current Every Day Smoker    Packs/day: 2.00    Years: 55.00    Types: Cigarettes  . Smokeless tobacco: Never Used  . Alcohol use No  . Drug use: No  . Sexual activity: Not on file   Other Topics Concern  . Not on file   Social History Narrative   Caffeine: 3 sodas/day (diet [address])   Lives with wife, 2 dogs   3rd marriage x 10 years (grown children)   Occupation: Truck [employer] doctor   Activity:  hunts   Diet: daily fruits/vegetables, no fish, red meat daily     Constitutional: Denies fever, malaise, fatigue, headache or abrupt weight changes.  Skin: Pt reports boil. Denies rashes or ulcercations.    No other specific complaints in a complete review of systems (except as listed in HPI above).     Objective:   Physical Exam  BP (!) 150/86 (BP Location: Right Arm, Patient Position: Sitting, Cuff Size: Large)   Pulse (!) 102   Temp 98.9 F (37.2 C) (Oral)   Wt 201 lb (91.2 kg)   BMI 31.96 kg/m  Wt Readings from Last 3 Encounters:  12/01/15 201 lb (91.2 kg)  09/06/15 202 lb (91.6 kg)  12/31/14 207 lb (93.9 kg)    General: Appears his stated age, obese in NAD. Skin: 1.5 cm round boil noted on left upper thigh, adjacent to groin. Surrounding cellulitis.  BMET    Component Value Date/Time   NA 135 10/08/2014 1400   K 4.3 10/08/2014 1400   CL 102 10/08/2014 1400   CO2 23 10/08/2014 1400   GLUCOSE 202 (H) 10/08/2014 1400   BUN 17 10/08/2014 1400   CREATININE 0.70 10/08/2014 1400   CALCIUM 9.2 10/08/2014 1400   GFRNONAA >60 10/08/2014 1400   GFRAA >60 10/08/2014 1400    Lipid Panel     Component Value Date/Time   CHOL 179 04/09/2011 1224   TRIG 164.0 (H) 04/09/2011 1224   HDL 36.80 (L) 04/09/2011 1224   CHOLHDL 5 04/09/2011 1224   VLDL 32.8 04/09/2011 1224   LDLCALC 109 (H) 04/09/2011 1224    CBC    Component Value Date/Time   WBC 11.0 (H) 10/08/2014 1400   RBC 5.29 10/08/2014 1400   HGB 16.1 10/08/2014 1400   HCT 46.4 10/08/2014 1400   PLT 238 10/08/2014 1400   MCV 87.7 10/08/2014 1400   MCH 30.4 10/08/2014 1400   MCHC 34.7 10/08/2014 1400   RDW 13.2 10/08/2014 1400   LYMPHSABS 4.1 (H) 10/08/2014 1400   MONOABS 1.0 10/08/2014 1400   EOSABS 0.2 10/08/2014 1400   BASOSABS 0.0 10/08/2014 1400    Hgb A1C Lab Results  Component Value Date   HGBA1C 7.7 (H) 08/18/2013            Assessment & Plan:   Abscess left thigh:  I& D  performed, see procedure note RX for Percocet for pain relief eRx for Septra BID x 10 days Continue warm compresses to encourage it to drain  Procedure Note:  Discussed risk and benefits of procedure including bleeding and infection Verbal consent obtained Area cleansed with Betadine x 3 Area numbed with Lidocaine with Epi, 1 mL Area incised with #11 blade Pus expressed Area cleansed with sterile water Applied TAB with pressure dressing Aftercare instructions given  RTC as needed or if symptoms persist or worsen Saahir Prude, NP

## 2015-12-01 NOTE — Addendum Note (Signed)
Addended by: Lorre Munroe on: 12/01/2015 03:35 PM   Modules accepted: Orders

## 2016-03-04 ENCOUNTER — Encounter: Payer: BLUE CROSS/BLUE SHIELD | Admitting: Internal Medicine

## 2016-07-19 LAB — BASIC METABOLIC PANEL
Creatinine: 0.8 mg/dL (ref 0.6–1.3)
Glucose: 207 mg/dL

## 2016-07-19 LAB — HEMOGLOBIN A1C: Hemoglobin A1C: 10.3

## 2016-07-28 ENCOUNTER — Telehealth: Payer: Self-pay | Admitting: Family Medicine

## 2016-07-28 NOTE — Telephone Encounter (Signed)
Received labs from Dr Evelene CroonWolff urologist with A1c 10.3. Placed in [facility]' box for input and scanning. Pt overdue for f/u uncontrolled diabetes. Please call and schedule appointment.

## 2016-07-29 NOTE — Telephone Encounter (Signed)
Message left for patient to call and schedule appt

## 2016-07-30 ENCOUNTER — Encounter: Payer: Self-pay | Admitting: Family Medicine

## 2016-07-31 ENCOUNTER — Encounter: Payer: Self-pay | Admitting: *Deleted

## 2016-07-31 NOTE — Telephone Encounter (Signed)
Letter mailed advising patient to call and schedule appt.

## 2017-04-08 ENCOUNTER — Ambulatory Visit (INDEPENDENT_AMBULATORY_CARE_PROVIDER_SITE_OTHER): Payer: Worker's Compensation | Admitting: Orthopaedic Surgery

## 2017-04-08 DIAGNOSIS — M67922 Unspecified disorder of synovium and tendon, left upper arm: Secondary | ICD-10-CM | POA: Diagnosis not present

## 2017-04-08 MED ORDER — PREDNISONE 10 MG (21) PO TBPK: ORAL_TABLET | ORAL | 0 refills | Status: DC

## 2017-04-08 MED ORDER — DICLOFENAC SODIUM 1 % TD GEL: 2.0000 g | Freq: Four times a day (QID) | TRANSDERMAL | 5 refills | Status: DC

## 2017-04-08 NOTE — Progress Notes (Signed)
Office Visit Note   Patient: Mathew Lamb           Date of Birth: Jan 31, 1951           MRN: [id number] Visit Date: 04/08/2017              Requested by: Eustaquio BoydenGutierrez, Javier, MD [address] [place], [place] [id number] PCP: Eustaquio BoydenGutierrez, Javier, MD   Assessment & Plan: Visit Diagnoses:  1. Biceps tendinopathy, left     Plan: Impression is left distal biceps tendinosis.  Prescription for Voltaren gel and prednisone taper.  Physical therapy referral was given.  No lifting more than 10 pounds for 6 weeks.  Follow-up in 6 weeks for recheck.  Follow-Up Instructions: Return in about 6 weeks (around 05/20/2017).   Orders:  No orders of the defined types were placed in this encounter.  Meds ordered this encounter  Medications  . predniSONE (STERAPRED UNI-PAK 21 TAB) 10 MG (21) TBPK tablet    Sig: Take as directed    Dispense:  21 tablet    Refill:  0  . diclofenac sodium (VOLTAREN) 1 % GEL    Sig: Apply 2 g topically 4 (four) times daily.    Dispense:  1 Tube    Refill:  5      Procedures: No procedures performed   Clinical Data: No additional findings.   Subjective: No chief complaint on file.   Patient is a 6 mm comes in with 4-week history of left elbow pain in the antecubital fossa.  MRI has been performed with tendinosis without any significant tearing.  He originally felt a pulling sensation in his elbow when he was pulling a box.  He does endorse some slight numbness in his thumb.  He is a Naval architecttruck driver.  Denies any neck pain.    Review of Systems  Constitutional: Negative.   All other systems reviewed and are negative.    Objective: Vital Signs: There were no vitals taken for this visit.  Physical Exam  Constitutional: He is oriented to person, place, and time. He appears well-developed and well-nourished.  HENT:  Head: Normocephalic and atraumatic.  Eyes: Pupils are equal, round, and reactive to light.  Neck: Neck supple.  Pulmonary/Chest:  Effort normal.  Abdominal: Soft.  Musculoskeletal: Normal range of motion.  Neurological: He is alert and oriented to person, place, and time.  Skin: Skin is warm.  Psychiatric: He has a normal mood and affect. His behavior is normal. Judgment and thought content normal.  Nursing note and vitals reviewed.   Ortho Exam Left elbow exam shows tenderness of the distal biceps tendon.  He has minimal discomfort with resisted elbow supination and elbow flexion. Specialty Comments:  No specialty comments available.  Imaging: No results found.   PMFS History: Patient Active Problem List   Diagnosis Date Noted  . Biceps tendinopathy, left 04/08/2017  . Malaise 08/19/2013  . Atypical chest pain 03/06/2012  . Chest pain 02/19/2012  . LBP (low back pain) 01/28/2012  . Healthcare maintenance 04/09/2011  . Calf pain 04/09/2011  . Smoking 04/09/2011  . UNSPECIFIED VITAMIN D DEFICIENCY 07/12/2009  . Diabetes type 2, uncontrolled (HCC) 03/02/2009  . LOW BACK PAIN, CHRONIC 07/05/2008  . DISTURBANCES OF SENSATION OF SMELL AND TASTE 08/11/2007  . LYME DISEASE 06/09/2007  . PROSTATITIS, HX OF 06/09/2007   Past Medical History:  Diagnosis Date  . Allergy   . Anxiety   . Arthritis   . Chronic low back pain   .  GERD (gastroesophageal reflux disease)   . Heart murmur   . History of kidney stones ~2005   found on CT  . History of Lyme disease 1990s  . History of prostatitis   . Smoker   . T2DM (type 2 diabetes mellitus) (HCC) 2010  . Vitamin D deficiency     Family History  Problem Relation Age of Onset  . Diabetes Mother   . Cirrhosis Mother   . Alcohol abuse Mother   . Pulmonary embolism Father   . Alcohol abuse Father   . Coronary artery disease Neg Hx   . Stroke Neg Hx   . Cancer Neg Hx     Past Surgical History:  Procedure Laterality Date  . EXTRACORPOREAL SHOCK WAVE LITHOTRIPSY Left 10/20/2014   Procedure: EXTRACORPOREAL SHOCK WAVE LITHOTRIPSY (ESWL);  Surgeon: Orson ApeMichael R  Wolff, MD;  Location: [facility] ORS;  Service: Urology;  Laterality: Left;  . FINGER SURGERY     right second finger   Social History   Occupational History  . 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 Smoking status: Current Every Day Smoker    Packs/day: 2.00    Years: 55.00    Pack years: 110.00    Types: Cigarettes  . Smokeless tobacco: Never Used  Substance and Sexual Activity  . Alcohol use: No    Alcohol/week: 0.0 oz  . Drug use: No  . Sexual activity: Not on file

## 2017-04-11 ENCOUNTER — Telehealth (INDEPENDENT_AMBULATORY_CARE_PROVIDER_SITE_OTHER): Payer: Self-pay

## 2017-04-11 NOTE — Telephone Encounter (Signed)
Faxed the 04/08/17 office note and work note to wc claim rep per her request

## 2017-05-27 ENCOUNTER — Ambulatory Visit (INDEPENDENT_AMBULATORY_CARE_PROVIDER_SITE_OTHER): Payer: Worker's Compensation | Admitting: Orthopaedic Surgery

## 2017-05-27 ENCOUNTER — Encounter (INDEPENDENT_AMBULATORY_CARE_PROVIDER_SITE_OTHER): Payer: Self-pay | Admitting: Orthopaedic Surgery

## 2017-05-27 DIAGNOSIS — M67922 Unspecified disorder of synovium and tendon, left upper arm: Secondary | ICD-10-CM

## 2017-05-27 NOTE — Progress Notes (Signed)
Office Visit Note   Patient: Mathew Lamb           Date of Birth: [birth year]           MRN: [id number] Visit Date: 05/27/2017              Requested by: Eustaquio Boyden, MD [address] [place], [place] [address] PCP: Eustaquio Boyden, MD   Assessment & Plan: Visit Diagnoses:  1. Biceps tendinopathy, left     Plan: Patient has been improving slowly over the last 6 weeks.  He is certainly not where he wants to be.  I would like to put him on 2 months of lifting restrictions at work.  I do think that he would benefit from PRP injection which I hope is Worker's Comp. will approve.  I will see him back in 2 months for recheck sooner if the PRP injection is approved. Total face to face encounter time was greater than 25 minutes and over half of this time was spent in counseling and/or coordination of care.  Follow-Up Instructions: Return in about 2 months (around 07/25/2017).   Orders:  No orders of the defined types were placed in this encounter.  No orders of the defined types were placed in this encounter.     Procedures: No procedures performed   Clinical Data: No additional findings.   Subjective: No chief complaint on file.   Patient follows up today for his distal biceps tendinosis.  He is doing slightly better.  He is definitely still having trouble with any sort of heavy lifting.  He is currently doing physical therapy and using Voltaren gel.  He is on modified duty.    Review of Systems   Objective: Vital Signs: There were no vitals taken for this visit.  Physical Exam  Ortho Exam Left elbow exam shows continued tenderness at the distal biceps tendon.  He has normal range of motion but he does have weakness secondary to pain with elbow flexion and supination. Specialty Comments:  No specialty comments available.  Imaging: No results found.   PMFS History: Patient Active Problem List   Diagnosis Date Noted  . Biceps tendinopathy, left  04/08/2017  . Malaise 08/19/2013  . Atypical chest pain 03/06/2012  . Chest pain 02/19/2012  . LBP (low back pain) 01/28/2012  . Healthcare maintenance 04/09/2011  . Calf pain 04/09/2011  . Smoking 04/09/2011  . UNSPECIFIED VITAMIN D DEFICIENCY 07/12/2009  . Diabetes type 2, uncontrolled (HCC) 03/02/2009  . LOW BACK PAIN, CHRONIC 07/05/2008  . DISTURBANCES OF SENSATION OF SMELL AND TASTE 08/11/2007  . LYME DISEASE 06/09/2007  . PROSTATITIS, HX OF 06/09/2007   Past Medical History:  Diagnosis Date  . Allergy   . Anxiety   . Arthritis   . Chronic low back pain   . GERD (gastroesophageal reflux disease)   . Heart murmur   . History of kidney stones ~2005   found on CT  . History of Lyme disease 1990s  . History of prostatitis   . Smoker   . T2DM (type 2 diabetes mellitus) (HCC) 2010  . Vitamin D deficiency     Family History  Problem Relation Age of Onset  . Diabetes Mother   . Cirrhosis Mother   . Alcohol abuse Mother   . Pulmonary embolism Father   . Alcohol abuse Father   . Coronary artery disease Neg Hx   . Stroke Neg Hx   . Cancer Neg Hx  Past Surgical History:  Procedure Laterality Date  . EXTRACORPOREAL SHOCK WAVE LITHOTRIPSY Left 10/20/2014   Procedure: EXTRACORPOREAL SHOCK WAVE LITHOTRIPSY (ESWL);  Surgeon: Orson ApeMichael R Wolff, MD;  Location: [facility] ORS;  Service: Urology;  Laterality: Left;  . FINGER SURGERY     right second finger   Social History   Occupational History  . 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 Smoking status: Current Every Day Smoker    Packs/day: 2.00    Years: 55.00    Pack years: 110.00    Types: Cigarettes  . Smokeless tobacco: Never Used  Substance and Sexual Activity  . Alcohol use: No    Alcohol/week: 0.0 oz  . Drug use: No  . Sexual activity: Not on file

## 2017-05-29 ENCOUNTER — Telehealth (INDEPENDENT_AMBULATORY_CARE_PROVIDER_SITE_OTHER): Payer: Self-pay

## 2017-05-29 NOTE — Telephone Encounter (Signed)
Faxed the 05/27/17 office and work note to [employer] Corporationatasha. Left her vm advising that note was faxed and that PRP injection was recommended and to let me know if this is approved.

## 2017-05-30 ENCOUNTER — Telehealth (INDEPENDENT_AMBULATORY_CARE_PROVIDER_SITE_OTHER): Payer: Self-pay

## 2017-05-30 NOTE — Telephone Encounter (Signed)
I received an email from wc adj Farrel Connersatasha Singh stating that the PRP injections that were mentioned in the last offcice note are approved. Wasn't sure where or how these needed to be scheduled.

## 2017-06-03 NOTE — Telephone Encounter (Signed)
I have emailed Josh with [employer] about this one.  Nelma RothmanLiz, FYI to you only.

## 2017-06-11 NOTE — Telephone Encounter (Signed)
IC patient and he says he was under the impression these injections were NOT approved by WC.  He will call his WC rep to clarify and then call me back.

## 2017-07-24 ENCOUNTER — Telehealth (INDEPENDENT_AMBULATORY_CARE_PROVIDER_SITE_OTHER): Payer: Self-pay

## 2017-07-24 NOTE — Telephone Encounter (Signed)
FYI-here is the email I received back in January stating injections were approved      External Email]Racette, Tonna CornerRALPH G [id number]  NS  . Press the Enter key to open the contact card."Farrel ConnersNatasha [email or web address]>   Reply   Fri 1/25, 9:43 AM  Zandyr Barnhill  *Caution - External Email* Good Morning Tate Zagal, Injections are approved, please let me know when appointment is scheduled. Thank you,  Farrel ConnersNatasha Singh  [facility]  Claims Associate [id number] [place], [place] [id number] [phone] phone  [phone] fax [email or web address] - email

## 2017-07-24 NOTE — Telephone Encounter (Signed)
Okay. Appt scheduled tomorrow. Are they aware W/C ppl? Is there a special way or who does this inj? Is there someone special that comes in to help?

## 2017-07-25 ENCOUNTER — Encounter (INDEPENDENT_AMBULATORY_CARE_PROVIDER_SITE_OTHER): Payer: Self-pay | Admitting: Orthopaedic Surgery

## 2017-07-25 ENCOUNTER — Ambulatory Visit (INDEPENDENT_AMBULATORY_CARE_PROVIDER_SITE_OTHER): Payer: Worker's Compensation | Admitting: Orthopaedic Surgery

## 2017-07-25 DIAGNOSIS — M67922 Unspecified disorder of synovium and tendon, left upper arm: Secondary | ICD-10-CM

## 2017-07-25 NOTE — Progress Notes (Signed)
Office Visit Note   Patient: Mathew Lamb           Date of Birth: 1951-04-06           MRN: [id number] Visit Date: 07/25/2017              Requested by: Eustaquio Boyden, MD [address] [place], [place] [address] PCP: Eustaquio Boyden, MD   Assessment & Plan: Visit Diagnoses:  1. Biceps tendinopathy, left     Plan: Impression is 67 year old gentleman with severe left distal biceps tendinosis.  From my standpoint I think it is reasonable to put him on a permanent restriction of less than 15 pounds of lifting at work to protect the tendon from rupturing.  Patient states that he plans on retiring within the next few months anyways.  From my standpoint patient has reached MMI.  He has a permanent partial impairment of 0%. Follow up as needed. Total face to face encounter time was greater than 25 minutes and over half of this time was spent in counseling and/or coordination of care.  Follow-Up Instructions: Return if symptoms worsen or fail to improve.   Orders:  No orders of the defined types were placed in this encounter.  No orders of the defined types were placed in this encounter.     Procedures: No procedures performed   Clinical Data: No additional findings.   Subjective: Chief Complaint  Patient presents with  . Left Elbow - Pain, Follow-up    Patient follows up today for his left distal biceps tendinosis.  He is overall feeling slightly better than before.  He still has significant weakness and pain with lifting anything more than 15 pounds.  He has trouble with repetitive work also.  He is been out of work due to his restrictions.  His PRP injections have not been approved   Review of Systems   Objective: Vital Signs: There were no vitals taken for this visit.  Physical Exam  Ortho Exam Left elbow exam is stable. Specialty Comments:  No specialty comments available.  Imaging: No results found.   PMFS History: Patient Active Problem  List   Diagnosis Date Noted  . Biceps tendinopathy, left 04/08/2017  . Malaise 08/19/2013  . Atypical chest pain 03/06/2012  . Chest pain 02/19/2012  . LBP (low back pain) 01/28/2012  . Healthcare maintenance 04/09/2011  . Calf pain 04/09/2011  . Smoking 04/09/2011  . UNSPECIFIED VITAMIN D DEFICIENCY 07/12/2009  . Diabetes type 2, uncontrolled (HCC) 03/02/2009  . LOW BACK PAIN, CHRONIC 07/05/2008  . DISTURBANCES OF SENSATION OF SMELL AND TASTE 08/11/2007  . LYME DISEASE 06/09/2007  . PROSTATITIS, HX OF 06/09/2007   Past Medical History:  Diagnosis Date  . Allergy   . Anxiety   . Arthritis   . Chronic low back pain   . GERD (gastroesophageal reflux disease)   . Heart murmur   . History of kidney stones ~2005   found on CT  . History of Lyme disease 1990s  . History of prostatitis   . Smoker   . T2DM (type 2 diabetes mellitus) (HCC) 2010  . Vitamin D deficiency     Family History  Problem Relation Age of Onset  . Diabetes Mother   . Cirrhosis Mother   . Alcohol abuse Mother   . Pulmonary embolism Father   . Alcohol abuse Father   . Coronary artery disease Neg Hx   . Stroke Neg Hx   . Cancer Neg  Hx     Past Surgical History:  Procedure Laterality Date  . EXTRACORPOREAL SHOCK WAVE LITHOTRIPSY Left 10/20/2014   Procedure: EXTRACORPOREAL SHOCK WAVE LITHOTRIPSY (ESWL);  Surgeon: Orson ApeMichael R Wolff, MD;  Location: [facility] ORS;  Service: Urology;  Laterality: Left;  . FINGER SURGERY     right second finger   Social History   Occupational History  . 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 Smoking status: Current Every Day Smoker    Packs/day: 2.00    Years: 55.00    Pack years: 110.00    Types: Cigarettes  . Smokeless tobacco: Never Used  Substance and Sexual Activity  . Alcohol use: No    Alcohol/week: 0.0 oz  . Drug use: No  . Sexual activity: Not on file

## 2017-07-25 NOTE — Telephone Encounter (Signed)
FYI   Patient follows up today for his distal biceps tendinosis.   He also states he does not want to have PRP Inj done. He states that Langley AdieMary Blackwell told him Inj's were not approved anyways. He has been sick and has other issues going on and does not want to have procedure Done.

## 2017-07-30 ENCOUNTER — Telehealth (INDEPENDENT_AMBULATORY_CARE_PROVIDER_SITE_OTHER): Payer: Self-pay

## 2017-07-30 NOTE — Telephone Encounter (Signed)
Emailed the 07/25/17 office and work note to wc adj Farrel ConnersNatasha Singh ([email or web address]) per her request

## 2017-07-30 NOTE — Telephone Encounter (Signed)
Emailed the 07/25/17 office and work note to wc adj Farrel ConnersNatasha Singh

## 2017-08-05 ENCOUNTER — Telehealth (INDEPENDENT_AMBULATORY_CARE_PROVIDER_SITE_OTHER): Payer: Self-pay

## 2017-08-05 NOTE — Telephone Encounter (Signed)
No follow up needed

## 2017-08-05 NOTE — Telephone Encounter (Signed)
Yes continue PT until patient meets PT goals

## 2017-08-05 NOTE — Telephone Encounter (Signed)
What about PT? Continue or stop?

## 2017-08-05 NOTE — Telephone Encounter (Signed)
Please advise,

## 2017-08-05 NOTE — Telephone Encounter (Signed)
Work comp needs clarification from pts last visit. Office note says he was rated and released but yet he has a rov scheduled for May. Was this a mistake? If he was released why does he need to return? Also, pt adv adjustor he was given a rx for additional PT. Is this correct and if so I need a copy of the rx to be able to send to them.

## 2017-08-06 ENCOUNTER — Other Ambulatory Visit (INDEPENDENT_AMBULATORY_CARE_PROVIDER_SITE_OTHER): Payer: Self-pay

## 2017-08-06 DIAGNOSIS — M67922 Unspecified disorder of synovium and tendon, left upper arm: Secondary | ICD-10-CM

## 2017-08-06 NOTE — Telephone Encounter (Signed)
See messages below. Order made in Epic. Thank you.

## 2017-08-06 NOTE — Telephone Encounter (Signed)
Emailed Farrel ConnersNatasha Singh @ [email or web address] to advise ([email or web address])

## 2017-09-01 ENCOUNTER — Ambulatory Visit (INDEPENDENT_AMBULATORY_CARE_PROVIDER_SITE_OTHER): Payer: Worker's Compensation | Admitting: Orthopaedic Surgery

## 2017-09-01 ENCOUNTER — Encounter (INDEPENDENT_AMBULATORY_CARE_PROVIDER_SITE_OTHER): Payer: Self-pay | Admitting: Orthopaedic Surgery

## 2017-09-01 DIAGNOSIS — M7522 Bicipital tendinitis, left shoulder: Secondary | ICD-10-CM | POA: Diagnosis not present

## 2017-09-01 NOTE — Progress Notes (Signed)
Office Visit Note   Patient: Mathew Lamb           Date of Birth: 24-Oct-1950           MRN: [id number] Visit Date: 09/01/2017              Requested by: Eustaquio Boyden, MD [address] [place], [place] [address] PCP: Eustaquio Boyden, MD   Assessment & Plan: Visit Diagnoses:  1. Biceps tendinitis on left     Plan: Given the fact that his strength is significantly limited secondary to the pain although his range of motion is not limited I do think that he has a permanent partial impairment of 10% of his left upper extremity.  Questions encouraged and answered.  Follow-up as needed.  Follow-Up Instructions: Return if symptoms worsen or fail to improve.   Orders:  No orders of the defined types were placed in this encounter.  No orders of the defined types were placed in this encounter.     Procedures: No procedures performed   Clinical Data: No additional findings.   Subjective: Chief Complaint  Patient presents with  . Left Arm - Pain, Follow-up    HPI patient has a 23-month history left distal biceps tendinitis.  This is under [employer].  We have been treating this with physical therapy and Voltaren gel.  He has significantly improved since the onset of symptoms, but is unable to do his regular job.  We have put him on permanent lifting restrictions of no more than 15 pounds.  Because of this, his employer is put him out of work.  He does note increased pain and cramping to the deltoid 2 days ago.  This was without injury or change in activity.  He is still in physical therapy where  he has plateaued.  Review of Systems as detailed in HPI.  All others reviewed and are negative.   Objective: Vital Signs: There were no vitals taken for this visit.  Physical Exam well-developed well-nourished gentleman no acute distress.  Alert and oriented x3.  Ortho Exam  Left elbow exam shows significant weakness with resistance to elbow flexion and forearm  supination. Specialty Comments:  No specialty comments available.  Imaging: No new imaging   PMFS History: Patient Active Problem List   Diagnosis Date Noted  . Biceps tendinitis on left 04/08/2017  . Malaise 08/19/2013  . Atypical chest pain 03/06/2012  . Chest pain 02/19/2012  . LBP (low back pain) 01/28/2012  . Healthcare maintenance 04/09/2011  . Calf pain 04/09/2011  . Smoking 04/09/2011  . UNSPECIFIED VITAMIN D DEFICIENCY 07/12/2009  . Diabetes type 2, uncontrolled (HCC) 03/02/2009  . LOW BACK PAIN, CHRONIC 07/05/2008  . DISTURBANCES OF SENSATION OF SMELL AND TASTE 08/11/2007  . LYME DISEASE 06/09/2007  . PROSTATITIS, HX OF 06/09/2007   Past Medical History:  Diagnosis Date  . Allergy   . Anxiety   . Arthritis   . Chronic low back pain   . GERD (gastroesophageal reflux disease)   . Heart murmur   . History of kidney stones ~2005   found on CT  . History of Lyme disease 1990s  . History of prostatitis   . Smoker   . T2DM (type 2 diabetes mellitus) (HCC) 2010  . Vitamin D deficiency     Family History  Problem Relation Age of Onset  . Diabetes Mother   . Cirrhosis Mother   . Alcohol abuse Mother   . Pulmonary embolism  Father   . Alcohol abuse Father   . Coronary artery disease Neg Hx   . Stroke Neg Hx   . Cancer Neg Hx     Past Surgical History:  Procedure Laterality Date  . EXTRACORPOREAL SHOCK WAVE LITHOTRIPSY Left 10/20/2014   Procedure: EXTRACORPOREAL SHOCK WAVE LITHOTRIPSY (ESWL);  Surgeon: Orson Ape, MD;  Location: [facility] ORS;  Service: Urology;  Laterality: Left;  . FINGER SURGERY     right second finger   Social History   Occupational History  . Occupation: Truck Hospital doctor  Tobacco Use  . Smoking status: Current Every Day Smoker    Packs/day: 2.00    Years: 55.00    Pack years: 110.00    Types: Cigarettes  . Smokeless tobacco: Never Used  Substance and Sexual Activity  . Alcohol use: No    Alcohol/week: 0.0 oz  . Drug use: No  .  Sexual activity: Not on file

## 2017-09-03 ENCOUNTER — Telehealth (INDEPENDENT_AMBULATORY_CARE_PROVIDER_SITE_OTHER): Payer: Self-pay

## 2017-09-03 NOTE — Telephone Encounter (Signed)
Continue the previous work note.  Permanent restrictions

## 2017-09-03 NOTE — Telephone Encounter (Signed)
Please advise 

## 2017-09-03 NOTE — Telephone Encounter (Signed)
Work comp needs updated work note from the 09/01/17 office visit

## 2017-09-04 ENCOUNTER — Ambulatory Visit (INDEPENDENT_AMBULATORY_CARE_PROVIDER_SITE_OTHER): Payer: Self-pay | Admitting: Orthopaedic Surgery

## 2017-09-05 ENCOUNTER — Encounter (INDEPENDENT_AMBULATORY_CARE_PROVIDER_SITE_OTHER): Payer: Self-pay

## 2017-09-05 NOTE — Telephone Encounter (Signed)
Emailed work note and last office note to [employer] Associate [address] [place], [place] [address] [phone] phone  [phone] fax [email or web address]

## 2017-09-05 NOTE — Telephone Encounter (Signed)
Letter in chart

## 2017-11-14 ENCOUNTER — Telehealth (INDEPENDENT_AMBULATORY_CARE_PROVIDER_SITE_OTHER): Payer: Self-pay

## 2017-11-14 NOTE — Telephone Encounter (Signed)
Emailed adj to advise

## 2017-11-14 NOTE — Telephone Encounter (Signed)
May need it permanently

## 2017-11-14 NOTE — Telephone Encounter (Signed)
Received following email from wc adj regarding Voltaren. Can you please advise?    We need to know if the Voltaren will be needed on a permanent basis for this patient to see how we should proceed with the claim. He is of retirement age so we have to consider everything to include possible future care. Thank you for your help.  [facility] Rob[facility]aw [facility]  Claim Rep I [address] [place], [place], [id number] [phone] phone  [phone] fax  [email or web address] email [email or web address]

## 2017-11-21 ENCOUNTER — Ambulatory Visit (INDEPENDENT_AMBULATORY_CARE_PROVIDER_SITE_OTHER): Payer: Worker's Compensation | Admitting: Orthopaedic Surgery

## 2017-11-21 DIAGNOSIS — M67922 Unspecified disorder of synovium and tendon, left upper arm: Secondary | ICD-10-CM | POA: Diagnosis not present

## 2017-11-21 NOTE — Progress Notes (Signed)
Office Visit Note   Patient: Mathew Lamb           Date of Birth: 1950-05-11           MRN: [id number] Visit Date: 11/21/2017              Requested by: Eustaquio Boyden, MD [address] [place], [place] [address] PCP: Eustaquio Boyden, MD   Assessment & Plan: Visit Diagnoses:  1. Biceps tendinopathy, left     Plan: Impression is severe left distal biceps tendinopathy.  Recommend continued use of TENS unit and Voltaren gel.  Patient knows that he has permanent restrictions for no heavy lifting.  From my standpoint I will see him back as needed.  Follow-Up Instructions: Return if symptoms worsen or fail to improve.   Orders:  No orders of the defined types were placed in this encounter.  No orders of the defined types were placed in this encounter.     Procedures: No procedures performed   Clinical Data: No additional findings.   Subjective: Chief Complaint  Patient presents with  . Left Upper Arm - Pain, Follow-up    Patient returns today for follow-up of distal biceps tendon.  Overall he is doing well.  He is requiring his TENS unit and Voltaren gel on a daily basis.   Review of Systems   Objective: Vital Signs: There were no vitals taken for this visit.  Physical Exam  Ortho Exam Left elbow exam shows a palpable distal biceps tendon. Specialty Comments:  No specialty comments available.  Imaging: No results found.   PMFS History: Patient Active Problem List   Diagnosis Date Noted  . Biceps tendinopathy, left 11/21/2017  . Biceps tendinitis on left 04/08/2017  . Malaise 08/19/2013  . Atypical chest pain 03/06/2012  . Chest pain 02/19/2012  . LBP (low back pain) 01/28/2012  . Healthcare maintenance 04/09/2011  . Calf pain 04/09/2011  . Smoking 04/09/2011  . UNSPECIFIED VITAMIN D DEFICIENCY 07/12/2009  . Diabetes type 2, uncontrolled (HCC) 03/02/2009  . LOW BACK PAIN, CHRONIC 07/05/2008  . DISTURBANCES OF SENSATION OF SMELL AND  TASTE 08/11/2007  . LYME DISEASE 06/09/2007  . PROSTATITIS, HX OF 06/09/2007   Past Medical History:  Diagnosis Date  . Allergy   . Anxiety   . Arthritis   . Chronic low back pain   . GERD (gastroesophageal reflux disease)   . Heart murmur   . History of kidney stones ~2005   found on CT  . History of Lyme disease 1990s  . History of prostatitis   . Smoker   . T2DM (type 2 diabetes mellitus) (HCC) 2010  . Vitamin D deficiency     Family History  Problem Relation Age of Onset  . Diabetes Mother   . Cirrhosis Mother   . Alcohol abuse Mother   . Pulmonary embolism Father   . Alcohol abuse Father   . Coronary artery disease Neg Hx   . Stroke Neg Hx   . Cancer Neg Hx     Past Surgical History:  Procedure Laterality Date  . EXTRACORPOREAL SHOCK WAVE LITHOTRIPSY Left 10/20/2014   Procedure: EXTRACORPOREAL SHOCK WAVE LITHOTRIPSY (ESWL);  Surgeon: Orson Ape, MD;  Location: [facility] ORS;  Service: Urology;  Laterality: Left;  . FINGER SURGERY     right second finger   Social History   Occupational History  . Occupation: Truck Hospital doctor  Tobacco Use  . Smoking status: Current Every Day Smoker  Packs/day: 2.00    Years: 55.00    Pack years: 110.00    Types: Cigarettes  . Smokeless tobacco: Never Used  Substance and Sexual Activity  . Alcohol use: No    Alcohol/week: 0.0 oz  . Drug use: No  . Sexual activity: Not on file

## 2017-12-04 ENCOUNTER — Telehealth (INDEPENDENT_AMBULATORY_CARE_PROVIDER_SITE_OTHER): Payer: Self-pay

## 2017-12-04 NOTE — Telephone Encounter (Signed)
Emailed 11/21/17 office note to wc adj per her request

## 2017-12-13 ENCOUNTER — Encounter: Payer: Self-pay | Admitting: Family Medicine

## 2017-12-16 ENCOUNTER — Encounter (INDEPENDENT_AMBULATORY_CARE_PROVIDER_SITE_OTHER): Payer: Self-pay | Admitting: Orthopaedic Surgery

## 2017-12-16 ENCOUNTER — Ambulatory Visit (INDEPENDENT_AMBULATORY_CARE_PROVIDER_SITE_OTHER): Payer: Worker's Compensation | Admitting: Orthopaedic Surgery

## 2017-12-16 DIAGNOSIS — M67922 Unspecified disorder of synovium and tendon, left upper arm: Secondary | ICD-10-CM | POA: Diagnosis not present

## 2017-12-16 MED ORDER — TRAMADOL HCL 50 MG PO TABS: 50.0000 mg | ORAL_TABLET | Freq: Four times a day (QID) | ORAL | 5 refills | Status: DC | PRN

## 2017-12-16 NOTE — Progress Notes (Signed)
Office Visit Note   Patient: Mathew SplinterRalph G Faw           Date of Birth: 1950/12/15           MRN: [id number] Visit Date: 12/16/2017              Requested by: Eustaquio BoydenGutierrez, Javier, MD [address] [place], [place] [id number] PCP: Eustaquio BoydenGutierrez, Javier, MD   Assessment & Plan: Visit Diagnoses:  1. Biceps tendinopathy, left     Plan: Impression is left distal biceps tendinopathy, severe in nature.  At this point, the patient will not need permanent use of his TENS unit.  He will need permanent Voltaren gel, however.  Continue permanent restrictions of no lifting greater than 15 pounds.  Follow-up with us as needed.  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  Follow-Up Instructions: Return if symptoms worsen or fail to improve.   Orders:  No orders of the defined types were placed in this encounter.  Meds ordered this encounter  Medications  . traMADol (ULTRAM) 50 MG tablet    Sig: Take 1 tablet (50 mg total) by mouth every 6 (six) hours as needed.    Dispense:  30 tablet    Refill:  5      Procedures: No procedures performed   Clinical Data: No additional findings.   Subjective: Chief Complaint  Patient presents with  . Left Upper Arm - Follow-up, Pain    HPI patient is a pleasant 67 year old gentleman who presents to our clinic today for follow-up of his left distal biceps tendinopathy.  Initial injury occurred in October while at work.  This is a Designer, multimediaWorkmen's Comp. claim.  He has been on permanent restrictions for no heavy lifting which has put him out of work.  He has recently been using a TENS unit and Voltaren gel.  He admits to a 5 out of 10 pain with minimal relief from the TENS unit.  More relief from the Voltaren gel.  He has finished formal physical therapy where he has plateaued.  He still admits to a fair amount of weakness to the left upper extremity.  Review of Systems as detailed in HPI.  All others reviewed and are  negative.   Objective: Vital Signs: There were no vitals taken for this visit.  Physical Exam well-developed well-nourished gentleman no acute distress.  Alert and oriented x3  Ortho Exam examination of his left upper extremity reveals full range of motion but he does exhibit significant weakness.  He is neurovascularly intact distally.  Specialty Comments:  No specialty comments available.  Imaging: No new imaging.   PMFS History: Patient Active Problem List   Diagnosis Date Noted  . Biceps tendinopathy, left 04/08/2017  . Malaise 08/19/2013  . Atypical chest pain 03/06/2012  . Chest pain 02/19/2012  . LBP (low back pain) 01/28/2012  . Healthcare maintenance 04/09/2011  . Calf pain 04/09/2011  . Smoking 04/09/2011  . UNSPECIFIED VITAMIN D DEFICIENCY 07/12/2009  . Diabetes type 2, uncontrolled (HCC) 03/02/2009  . LOW BACK PAIN, CHRONIC 07/05/2008  . DISTURBANCES OF SENSATION OF SMELL AND TASTE 08/11/2007  . LYME DISEASE 06/09/2007  . PROSTATITIS, HX OF 06/09/2007   Past Medical History:  Diagnosis Date  . Allergy   . Anxiety   . Arthritis   . Chronic low back pain   . GERD (gastroesophageal reflux disease)   . Heart murmur   . History of kidney stones ~2005   found  on CT  . History of Lyme disease 1990s  . History of prostatitis   . Smoker   . T2DM (type 2 diabetes mellitus) (HCC) 2010  . Vitamin D deficiency     Family History  Problem Relation Age of Onset  . Diabetes Mother   . Cirrhosis Mother   . Alcohol abuse Mother   . Pulmonary embolism Father   . Alcohol abuse Father   . Coronary artery disease Neg Hx   . Stroke Neg Hx   . Cancer Neg Hx     Past Surgical History:  Procedure Laterality Date  . EXTRACORPOREAL SHOCK WAVE LITHOTRIPSY Left 10/20/2014   Procedure: EXTRACORPOREAL SHOCK WAVE LITHOTRIPSY (ESWL);  Surgeon: Orson ApeMichael R Wolff, MD;  Location: [facility] ORS;  Service: Urology;  Laterality: Left;  . FINGER SURGERY     right second finger    Social History   Occupational History  . 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 Smoking status: Current Every Day Smoker    Packs/day: 2.00    Years: 55.00    Pack years: 110.00    Types: Cigarettes  . Smokeless tobacco: Never Used  Substance and Sexual Activity  . Alcohol use: No    Alcohol/week: 0.0 standard drinks  . Drug use: No  . Sexual activity: Not on file

## 2018-01-22 ENCOUNTER — Telehealth (INDEPENDENT_AMBULATORY_CARE_PROVIDER_SITE_OTHER): Payer: Self-pay

## 2018-01-22 NOTE — Telephone Encounter (Signed)
Faxed the 12/16/17 office note and work note to adj per her request

## 2018-01-22 NOTE — Telephone Encounter (Signed)
Emailed the 12/16/17 office note and work note to case mgr per her request

## 2018-01-28 ENCOUNTER — Telehealth (INDEPENDENT_AMBULATORY_CARE_PROVIDER_SITE_OTHER): Payer: Self-pay | Admitting: Orthopaedic Surgery

## 2018-01-28 NOTE — Telephone Encounter (Signed)
FAXED

## 2018-01-28 NOTE — Telephone Encounter (Signed)
Voicemail was left by [facility] [employer] for [facility] states she needs medical notes for OV 12/16/17. She would like this faxed to [phone]  Did not leave telephone number.

## 2018-03-30 ENCOUNTER — Other Ambulatory Visit (INDEPENDENT_AMBULATORY_CARE_PROVIDER_SITE_OTHER): Payer: Self-pay | Admitting: Physician Assistant

## 2018-03-30 NOTE — Telephone Encounter (Signed)
Ok to refill 

## 2018-04-17 ENCOUNTER — Other Ambulatory Visit (INDEPENDENT_AMBULATORY_CARE_PROVIDER_SITE_OTHER): Payer: Self-pay | Admitting: Physician Assistant

## 2018-04-17 NOTE — Telephone Encounter (Signed)
Will need to get further refills from pcp

## 2018-05-05 ENCOUNTER — Other Ambulatory Visit (INDEPENDENT_AMBULATORY_CARE_PROVIDER_SITE_OTHER): Payer: Self-pay | Admitting: Physician Assistant

## 2018-05-05 NOTE — Telephone Encounter (Signed)
Ok to refill 

## 2018-05-07 NOTE — Telephone Encounter (Signed)
Called into pharm  

## 2018-06-09 DIAGNOSIS — B078 Other viral warts: Secondary | ICD-10-CM | POA: Diagnosis not present

## 2018-08-06 DIAGNOSIS — K439 Ventral hernia without obstruction or gangrene: Secondary | ICD-10-CM | POA: Diagnosis not present

## 2018-08-06 DIAGNOSIS — K5730 Diverticulosis of large intestine without perforation or abscess without bleeding: Secondary | ICD-10-CM | POA: Diagnosis not present

## 2018-08-06 DIAGNOSIS — Z1211 Encounter for screening for malignant neoplasm of colon: Secondary | ICD-10-CM | POA: Diagnosis not present

## 2018-08-06 DIAGNOSIS — K219 Gastro-esophageal reflux disease without esophagitis: Secondary | ICD-10-CM | POA: Diagnosis not present

## 2018-08-06 DIAGNOSIS — R1032 Left lower quadrant pain: Secondary | ICD-10-CM | POA: Diagnosis not present

## 2018-08-10 DIAGNOSIS — Z720 Tobacco use: Secondary | ICD-10-CM | POA: Diagnosis not present

## 2018-08-10 DIAGNOSIS — Z1329 Encounter for screening for other suspected endocrine disorder: Secondary | ICD-10-CM | POA: Diagnosis not present

## 2018-08-10 DIAGNOSIS — I10 Essential (primary) hypertension: Secondary | ICD-10-CM | POA: Diagnosis not present

## 2018-08-10 DIAGNOSIS — Z1389 Encounter for screening for other disorder: Secondary | ICD-10-CM | POA: Diagnosis not present

## 2018-08-10 DIAGNOSIS — N401 Enlarged prostate with lower urinary tract symptoms: Secondary | ICD-10-CM | POA: Diagnosis not present

## 2018-08-10 DIAGNOSIS — Z0001 Encounter for general adult medical examination with abnormal findings: Secondary | ICD-10-CM | POA: Diagnosis not present

## 2018-08-10 DIAGNOSIS — Z01118 Encounter for examination of ears and hearing with other abnormal findings: Secondary | ICD-10-CM | POA: Diagnosis not present

## 2018-08-10 DIAGNOSIS — Z01021 Encounter for examination of eyes and vision following failed vision screening with abnormal findings: Secondary | ICD-10-CM | POA: Diagnosis not present

## 2018-08-10 DIAGNOSIS — Z131 Encounter for screening for diabetes mellitus: Secondary | ICD-10-CM | POA: Diagnosis not present

## 2018-08-10 DIAGNOSIS — E1165 Type 2 diabetes mellitus with hyperglycemia: Secondary | ICD-10-CM | POA: Diagnosis not present

## 2018-08-10 DIAGNOSIS — Z5181 Encounter for therapeutic drug level monitoring: Secondary | ICD-10-CM | POA: Diagnosis not present

## 2018-08-10 DIAGNOSIS — Z136 Encounter for screening for cardiovascular disorders: Secondary | ICD-10-CM | POA: Diagnosis not present

## 2018-08-24 DIAGNOSIS — E669 Obesity, unspecified: Secondary | ICD-10-CM | POA: Diagnosis not present

## 2018-08-24 DIAGNOSIS — E782 Mixed hyperlipidemia: Secondary | ICD-10-CM | POA: Diagnosis not present

## 2018-08-24 DIAGNOSIS — Z0001 Encounter for general adult medical examination with abnormal findings: Secondary | ICD-10-CM | POA: Diagnosis not present

## 2018-08-24 DIAGNOSIS — E1165 Type 2 diabetes mellitus with hyperglycemia: Secondary | ICD-10-CM | POA: Diagnosis not present

## 2018-08-24 DIAGNOSIS — N401 Enlarged prostate with lower urinary tract symptoms: Secondary | ICD-10-CM | POA: Diagnosis not present

## 2018-08-24 DIAGNOSIS — Z720 Tobacco use: Secondary | ICD-10-CM | POA: Diagnosis not present

## 2018-08-24 DIAGNOSIS — I10 Essential (primary) hypertension: Secondary | ICD-10-CM | POA: Diagnosis not present

## 2018-08-31 DIAGNOSIS — E119 Type 2 diabetes mellitus without complications: Secondary | ICD-10-CM | POA: Diagnosis not present

## 2018-09-08 DIAGNOSIS — Z1211 Encounter for screening for malignant neoplasm of colon: Secondary | ICD-10-CM | POA: Diagnosis not present

## 2018-09-08 DIAGNOSIS — Z1212 Encounter for screening for malignant neoplasm of rectum: Secondary | ICD-10-CM | POA: Diagnosis not present

## 2018-09-12 ENCOUNTER — Encounter (HOSPITAL_BASED_OUTPATIENT_CLINIC_OR_DEPARTMENT_OTHER): Payer: Self-pay | Admitting: Emergency Medicine

## 2018-09-12 ENCOUNTER — Emergency Department (HOSPITAL_BASED_OUTPATIENT_CLINIC_OR_DEPARTMENT_OTHER)
Admission: EM | Admit: 2018-09-12 | Discharge: 2018-09-12 | Disposition: A | Discharge status: Home / Self Care | Payer: Medicare HMO | Attending: Emergency Medicine | Admitting: Emergency Medicine

## 2018-09-12 ENCOUNTER — Other Ambulatory Visit: Payer: Self-pay

## 2018-09-12 ENCOUNTER — Emergency Department (HOSPITAL_BASED_OUTPATIENT_CLINIC_OR_DEPARTMENT_OTHER): Payer: Medicare HMO

## 2018-09-12 DIAGNOSIS — F17210 Nicotine dependence, cigarettes, uncomplicated: Secondary | ICD-10-CM | POA: Diagnosis not present

## 2018-09-12 DIAGNOSIS — I10 Essential (primary) hypertension: Secondary | ICD-10-CM | POA: Insufficient documentation

## 2018-09-12 DIAGNOSIS — M79602 Pain in left arm: Secondary | ICD-10-CM | POA: Diagnosis not present

## 2018-09-12 DIAGNOSIS — Z79899 Other long term (current) drug therapy: Secondary | ICD-10-CM | POA: Insufficient documentation

## 2018-09-12 DIAGNOSIS — E119 Type 2 diabetes mellitus without complications: Secondary | ICD-10-CM | POA: Diagnosis not present

## 2018-09-12 DIAGNOSIS — R079 Chest pain, unspecified: Secondary | ICD-10-CM | POA: Diagnosis not present

## 2018-09-12 HISTORY — DX: Essential (primary) hypertension: I10

## 2018-09-12 LAB — COMPREHENSIVE METABOLIC PANEL
ALT: 23 U/L (ref 0–44)
AST: 18 U/L (ref 15–41)
Albumin: 4.1 g/dL (ref 3.5–5.0)
Alkaline Phosphatase: 69 U/L (ref 38–126)
Anion gap: 9 (ref 5–15)
BUN: 12 mg/dL (ref 8–23)
CO2: 21 mmol/L — ABNORMAL LOW (ref 22–32)
Calcium: 9.3 mg/dL (ref 8.9–10.3)
Chloride: 107 mmol/L (ref 98–111)
Creatinine, Ser: 0.61 mg/dL (ref 0.61–1.24)
GFR calc Af Amer: >60 mL/min (ref >60)
GFR calc non Af Amer: >60 mL/min (ref >60)
Glucose, Bld: 246 mg/dL — ABNORMAL HIGH (ref 70–99)
Potassium: 4.1 mmol/L (ref 3.5–5.1)
Sodium: 137 mmol/L (ref 135–145)
Total Bilirubin: 0.4 mg/dL (ref 0.3–1.2)
Total Protein: 7.3 g/dL (ref 6.5–8.1)

## 2018-09-12 LAB — CBC
HCT: 49.1 % (ref 39.0–52.0)
Hemoglobin: 16.4 g/dL (ref 13.0–17.0)
MCH: 30 pg (ref 26.0–34.0)
MCHC: 33.4 g/dL (ref 30.0–36.0)
MCV: 89.8 fL (ref 80.0–100.0)
Platelets: 224 10*3/uL (ref 150–400)
RBC: 5.47 MIL/uL (ref 4.22–5.81)
RDW: 12.8 % (ref 11.5–15.5)
WBC: 9.5 10*3/uL (ref 4.0–10.5)
nRBC: 0 % (ref 0.0–0.2)

## 2018-09-12 LAB — TROPONIN I: Troponin I: <0.03 ng/mL (ref <0.03)

## 2018-09-12 MED ORDER — DICLOFENAC SODIUM 1 % TD GEL: 2.0000 g | Freq: Four times a day (QID) | TRANSDERMAL | 5 refills | Status: AC

## 2018-09-12 MED ORDER — OXYCODONE-ACETAMINOPHEN 5-325 MG PO TABS: 1.0000 | ORAL_TABLET | Freq: Four times a day (QID) | ORAL | 0 refills | Status: DC | PRN

## 2018-09-12 NOTE — ED Provider Notes (Signed)
[facility] EMERGENCY DEPARTMENT Provider Note   CSN: [id number] Arrival date & time: 09/12/18  1238    History   Chief Complaint Chief Complaint  Patient presents with  . Arm Pain    HPI Mathew Lamb is a 68 y.o. male with history of diabetes, hypertension, smoker, GERD who presents with left upper arm pain that began when patient woke up around 6 AM.  It radiated to his left chest.  He describes the pain is sharp.  It is much improved from then, however still painful.  He denies any shortness of breath, diaphoresis, nausea, vomiting.  He reports he has been working on remodeling his bathroom and did remember later on in his visit that he fell on his left side and chest 2-3 days ago while working.  He also has history of left biceps tendinopathy.  He took Tylenol prior to arrival.     HPI  Past Medical History:  Diagnosis Date  . Allergy   . Anxiety   . Arthritis   . Chronic low back pain   . GERD (gastroesophageal reflux disease)   . Heart murmur   . History of kidney stones ~2005   found on CT  . History of Lyme disease 1990s  . History of prostatitis   . Hypertension   . Smoker   . T2DM (type 2 diabetes mellitus) (HCC) 2010  . Vitamin D deficiency     Patient Active Problem List   Diagnosis Date Noted  . Biceps tendinopathy, left 04/08/2017  . Malaise 08/19/2013  . Atypical chest pain 03/06/2012  . Chest pain 02/19/2012  . LBP (low back pain) 01/28/2012  . Healthcare maintenance 04/09/2011  . Calf pain 04/09/2011  . Smoking 04/09/2011  . UNSPECIFIED VITAMIN D DEFICIENCY 07/12/2009  . Diabetes type 2, uncontrolled (HCC) 03/02/2009  . LOW BACK PAIN, CHRONIC 07/05/2008  . DISTURBANCES OF SENSATION OF SMELL AND TASTE 08/11/2007  . LYME DISEASE 06/09/2007  . PROSTATITIS, HX OF 06/09/2007    Past Surgical History:  Procedure Laterality Date  . EXTRACORPOREAL SHOCK WAVE LITHOTRIPSY Left 10/20/2014   Procedure: EXTRACORPOREAL SHOCK WAVE LITHOTRIPSY  (ESWL);  Surgeon: Orson Ape, MD;  Location: [facility] ORS;  Service: Urology;  Laterality: Left;  . FINGER SURGERY     right second finger        Home Medications    Prior to Admission medications   Medication Sig Start Date End Date Taking? Authorizing Provider  cetirizine (ZYRTEC) 10 MG tablet Take 10 mg by mouth daily.   Yes [provider]  glimepiride (AMARYL) 4 MG tablet Take 4 mg by mouth 2 (two) times daily.   Yes [provider]  tamsulosin (FLOMAX) 0.4 MG CAPS capsule Take 1 capsule (0.4 mg total) by mouth daily. 10/20/14  Yes Orson Ape, MD  acetaminophen (TYLENOL) 500 MG tablet Take 1,000 mg by mouth every 6 (six) hours as needed for pain. Reported on 09/06/2015    [provider]  Alum & Mag Hydroxide-Simeth (GI COCKTAIL) SUSP suspension Take 15 mLs by mouth 3 (three) times daily. Shake well. 10/12/14   Lorre Munroe, NP  diclofenac sodium (VOLTAREN) 1 % GEL Apply 2 g topically 4 (four) times daily. 09/12/18   Christabella Alvira M, PA-C  NUCYNTA 50 MG TABS tablet Take 1 tablet (50 mg total) by mouth every 6 (six) hours as needed. 1 TO 2 TABS Q 6 HOURS PRN PAIN Patient not taking: Reported on 09/01/2017 10/20/14  Orson Ape, MD  ondansetron (ZOFRAN ODT) 8 MG disintegrating tablet Take 1 tablet (8 mg total) by mouth every 6 (six) hours as needed for nausea or vomiting. Patient not taking: Reported on 09/01/2017 10/20/14   Orson Ape, MD  ondansetron (ZOFRAN) 4 MG tablet Take 1 tablet (4 mg total) by mouth every 8 (eight) hours as needed for nausea or vomiting. Patient not taking: Reported on 09/01/2017 09/06/15   Eustaquio Boyden, MD  oxyCODONE-acetaminophen (PERCOCET/ROXICET) 5-325 MG tablet Take 1 tablet by mouth every 6 (six) hours as needed for severe pain. 09/12/18   Semaje Kinker, Waylan Boga, PA-C  predniSONE (STERAPRED UNI-PAK 21 TAB) 10 MG (21) TBPK tablet Take as directed Patient not taking: Reported on 09/01/2017 04/08/17   Tarry Kos, MD   sucralfate (CARAFATE) 1 GM/10ML suspension Take 10 mLs (1 g total) by mouth 4 (four) times daily -  with meals and at bedtime. 10/08/14   Pisciotta, Joni Reining, PA-C  sulfamethoxazole-trimethoprim (BACTRIM,SEPTRA) 400-80 MG tablet Take 1 tablet by mouth 2 (two) times daily. 12/01/15   Lorre Munroe, NP  traMADol (ULTRAM) 50 MG tablet TAKE 1 TABLET BY MOUTH EVERY 6 HOURS AS NEEDED 05/07/18   Cristie Hem, PA-C    Family History Family History  Problem Relation Age of Onset  . Diabetes Mother   . Cirrhosis Mother   . Alcohol abuse Mother   . Pulmonary embolism Father   . Alcohol abuse Father   . Coronary artery disease Neg Hx   . Stroke Neg Hx   . Cancer Neg Hx     Social History Social History   Tobacco Use  . Smoking status: Current Every Day Smoker    Packs/day: 2.00    Years: 55.00    Pack years: 110.00    Types: Cigarettes  . Smokeless tobacco: Never Used  Substance Use Topics  . Alcohol use: No    Alcohol/week: 0.0 standard drinks  . Drug use: No     Allergies   Actos [pioglitazone hydrochloride]; Cephalexin; Ciprofloxacin; Codeine; Cyclobenzaprine hcl; Diclofenac sodium; Hydrocodone; Meperidine hcl; Meperidine hcl; Metformin; Oxycodone-aspirin; Penicillins; and Pentazocine lactate   Review of Systems Review of Systems  Constitutional: Negative for chills, diaphoresis and fever.  HENT: Negative for facial swelling and sore throat.   Respiratory: Negative for shortness of breath.   Cardiovascular: Positive for chest pain.  Gastrointestinal: Negative for abdominal pain, nausea and vomiting.  Genitourinary: Negative for dysuria.  Musculoskeletal: Positive for arthralgias and myalgias. Negative for back pain.  Skin: Negative for rash and wound.  Neurological: Negative for headaches.  Psychiatric/Behavioral: The patient is not nervous/anxious.      Physical Exam Updated Vital Signs BP (!) 152/82   Pulse 86   Temp 98.4 F (36.9 C) (Oral)   Resp (!) 24   Ht 5'  6" (1.676 m)   Wt 90.7 kg   SpO2 99%   BMI 32.28 kg/m   Physical Exam Vitals signs and nursing note reviewed.  Constitutional:      General: He is not in acute distress.    Appearance: He is well-developed. He is not diaphoretic.  HENT:     Head: Normocephalic and atraumatic.     Mouth/Throat:     Pharynx: No oropharyngeal exudate.  Eyes:     General: No scleral icterus.       Right eye: No discharge.        Left eye: No discharge.     Conjunctiva/sclera: Conjunctivae normal.  Pupils: Pupils are equal, round, and reactive to light.  Neck:     Musculoskeletal: Normal range of motion and neck supple.     Thyroid: No thyromegaly.  Cardiovascular:     Rate and Rhythm: Normal rate and regular rhythm.     Heart sounds: Normal heart sounds. No murmur. No friction rub. No gallop.   Pulmonary:     Effort: Pulmonary effort is normal. No respiratory distress.     Breath sounds: Normal breath sounds. No stridor. No wheezing or rales.  Chest:     Chest wall: No tenderness.  Abdominal:     General: Bowel sounds are normal. There is no distension.     Palpations: Abdomen is soft.     Tenderness: There is no abdominal tenderness. There is no guarding or rebound.  Musculoskeletal:       Arms:  Lymphadenopathy:     Cervical: No cervical adenopathy.  Skin:    General: Skin is warm and dry.     Coloration: Skin is not pale.     Findings: No rash.  Neurological:     Mental Status: He is alert.     Coordination: Coordination normal.      ED Treatments / Results  Labs (all labs ordered are listed, but only abnormal results are displayed) Labs Reviewed  COMPREHENSIVE METABOLIC PANEL - Abnormal; Notable for the following components:      Result Value   CO2 21 (*)    Glucose, Bld 246 (*)    All other components within normal limits  TROPONIN I  CBC  CBC WITH DIFFERENTIAL/PLATELET    EKG EKG Interpretation  Date/Time:  Saturday Sep 12 2018 12:51:06 EDT Ventricular Rate:   93 PR Interval:    QRS Duration: 98 QT Interval:  349 QTC Calculation: 435 R Axis:   21 Text Interpretation:  Sinus rhythm Borderline T abnormalities, lateral leads Baseline wander in lead(s) V3 V4 since last tracing no significant change Confirmed by Rolan Bucco [phone]) on 09/12/2018 1:19:38 PM   Radiology Dg Chest 2 View  Result Date: 09/12/2018 CLINICAL DATA:  Chest pain. EXAM: CHEST - 2 VIEW COMPARISON:  Radiographs of May 16, 2012. FINDINGS: The heart size and mediastinal contours are within normal limits. Both lungs are clear. No pneumothorax or pleural effusion is noted. The visualized skeletal structures are unremarkable. IMPRESSION: No active cardiopulmonary disease. Electronically Signed   By: Lupita Raider M.D.   On: 09/12/2018 14:30    Procedures Procedures (including critical care time)  Medications Ordered in ED Medications - No data to display   Initial Impression / Assessment and Plan / ED Course  I have reviewed the triage vital signs and the nursing notes.  Pertinent labs & imaging results that were available during my care of the patient were reviewed by me and considered in my medical decision making (see chart for details).        Patient presenting with left arm pain that began around 6 AM this morning.  It is reproducible on palpation.  It appears to be of musculoskeletal etiology.  Labs are unremarkable except for hyperglycemia at 246.  Troponin is negative.  Low suspicion of ACS.  Chest x-ray is negative.  EKG shows NSR and no significant change since last tracing.  Will discharge home with Voltaren gel as patient cannot take NSAIDs due to bleeding ulcers and short course of Percocet for breakthrough pain.  Ice and heat discussed.  Follow-up to orthopedics if symptoms are continuing.  Return precautions discussed.  Patient understands and agrees with plan.  Patient vital stable throughout ED course and discharged in satisfactory condition.  Patient also  evaluated by my attending, Dr. Fredderick PhenixBelfi, who guided the patient's management and agrees with plan.  Final Clinical Impressions(s) / ED Diagnoses   Final diagnoses:  Left arm pain    ED Discharge Orders         Ordered    oxyCODONE-acetaminophen (PERCOCET/ROXICET) 5-325 MG tablet  Every 6 hours PRN     09/12/18 1506    diclofenac sodium (VOLTAREN) 1 % GEL  4 times daily     09/12/18 [address]           Gina Leblond, AngletonAlexandra M, PA-C 09/12/18 1646    Rolan BuccoBelfi, Melanie, MD 09/13/18 [id number]

## 2018-09-12 NOTE — Discharge Instructions (Signed)
We will add Voltaren gel to the affected area 4 times daily as needed.  For severe, breakthrough pain, you can take 1 Percocet every 6 hours.  Use ice and heat alternating 20 minutes on, 20 minutes off.  Please follow-up with your doctor or the sports medicine doctor, Dr. Pearletha Forge, for further evaluation and treatment of your symptoms.  Please return to the emergency department if you develop any new or worsening symptoms.  Do not drink alcohol, drive, operate machinery or participate in any other potentially dangerous activities while taking opiate pain medication as it may make you sleepy. Do not take this medication with any other sedating medications, either prescription or over-the-counter. If you were prescribed Percocet or Vicodin, do not take these with acetaminophen (Tylenol) as it is already contained within these medications and overdose of Tylenol is dangerous.   This medication is an opiate (or narcotic) pain medication and can be habit forming.  Use it as little as possible to achieve adequate pain control.  Do not use or use it with extreme caution if you have a history of opiate abuse or dependence. This medication is intended for your use only - do not give any to anyone else and keep it in a secure place where nobody else, especially children, have access to it. It will also cause or worsen constipation, so you may want to consider taking an over-the-counter stool softener while you are taking this medication.

## 2018-09-12 NOTE — ED Notes (Signed)
ED Provider at bedside. 

## 2018-09-12 NOTE — ED Triage Notes (Signed)
Pt reports sharp pain to upper L arm since this morning. Reports some radiation into his chest.

## 2018-09-12 NOTE — ED Notes (Signed)
Pt verbalized understanding of dc instructions.

## 2018-09-12 NOTE — ED Notes (Signed)
Patient stated that he fell last Thursday while doing work in his bathroom and he forgot to tell the EDP.  No bruised to left side of his chest.

## 2018-10-12 DIAGNOSIS — F5101 Primary insomnia: Secondary | ICD-10-CM | POA: Diagnosis not present

## 2018-10-12 DIAGNOSIS — E782 Mixed hyperlipidemia: Secondary | ICD-10-CM | POA: Diagnosis not present

## 2018-10-12 DIAGNOSIS — E669 Obesity, unspecified: Secondary | ICD-10-CM | POA: Diagnosis not present

## 2018-10-12 DIAGNOSIS — Z720 Tobacco use: Secondary | ICD-10-CM | POA: Diagnosis not present

## 2018-10-12 DIAGNOSIS — I10 Essential (primary) hypertension: Secondary | ICD-10-CM | POA: Diagnosis not present

## 2018-10-12 DIAGNOSIS — E1165 Type 2 diabetes mellitus with hyperglycemia: Secondary | ICD-10-CM | POA: Diagnosis not present

## 2018-10-12 DIAGNOSIS — N401 Enlarged prostate with lower urinary tract symptoms: Secondary | ICD-10-CM | POA: Diagnosis not present

## 2018-11-05 DIAGNOSIS — H35033 Hypertensive retinopathy, bilateral: Secondary | ICD-10-CM | POA: Diagnosis not present

## 2018-11-05 DIAGNOSIS — H2513 Age-related nuclear cataract, bilateral: Secondary | ICD-10-CM | POA: Diagnosis not present

## 2018-11-05 DIAGNOSIS — E119 Type 2 diabetes mellitus without complications: Secondary | ICD-10-CM | POA: Diagnosis not present

## 2018-11-26 DIAGNOSIS — H18413 Arcus senilis, bilateral: Secondary | ICD-10-CM | POA: Diagnosis not present

## 2018-11-26 DIAGNOSIS — H2512 Age-related nuclear cataract, left eye: Secondary | ICD-10-CM | POA: Diagnosis not present

## 2018-11-26 DIAGNOSIS — H25043 Posterior subcapsular polar age-related cataract, bilateral: Secondary | ICD-10-CM | POA: Diagnosis not present

## 2018-11-26 DIAGNOSIS — H25013 Cortical age-related cataract, bilateral: Secondary | ICD-10-CM | POA: Diagnosis not present

## 2018-11-26 DIAGNOSIS — H2513 Age-related nuclear cataract, bilateral: Secondary | ICD-10-CM | POA: Diagnosis not present

## 2018-12-07 DIAGNOSIS — I10 Essential (primary) hypertension: Secondary | ICD-10-CM | POA: Diagnosis not present

## 2018-12-07 DIAGNOSIS — E782 Mixed hyperlipidemia: Secondary | ICD-10-CM | POA: Diagnosis not present

## 2018-12-07 DIAGNOSIS — E1165 Type 2 diabetes mellitus with hyperglycemia: Secondary | ICD-10-CM | POA: Diagnosis not present

## 2018-12-07 DIAGNOSIS — E669 Obesity, unspecified: Secondary | ICD-10-CM | POA: Diagnosis not present

## 2018-12-07 DIAGNOSIS — N401 Enlarged prostate with lower urinary tract symptoms: Secondary | ICD-10-CM | POA: Diagnosis not present

## 2018-12-07 DIAGNOSIS — Z720 Tobacco use: Secondary | ICD-10-CM | POA: Diagnosis not present

## 2018-12-07 DIAGNOSIS — F5101 Primary insomnia: Secondary | ICD-10-CM | POA: Diagnosis not present

## 2018-12-16 DIAGNOSIS — H2513 Age-related nuclear cataract, bilateral: Secondary | ICD-10-CM | POA: Diagnosis not present

## 2018-12-16 DIAGNOSIS — H2512 Age-related nuclear cataract, left eye: Secondary | ICD-10-CM | POA: Diagnosis not present

## 2018-12-17 DIAGNOSIS — H2511 Age-related nuclear cataract, right eye: Secondary | ICD-10-CM | POA: Diagnosis not present

## 2018-12-24 DIAGNOSIS — M7989 Other specified soft tissue disorders: Secondary | ICD-10-CM | POA: Diagnosis not present

## 2018-12-25 ENCOUNTER — Other Ambulatory Visit: Payer: Self-pay | Admitting: Family Medicine

## 2018-12-25 DIAGNOSIS — M79621 Pain in right upper arm: Secondary | ICD-10-CM

## 2018-12-28 DIAGNOSIS — N644 Mastodynia: Secondary | ICD-10-CM | POA: Diagnosis not present

## 2018-12-29 ENCOUNTER — Other Ambulatory Visit: Payer: Medicare HMO

## 2018-12-30 DIAGNOSIS — H2511 Age-related nuclear cataract, right eye: Secondary | ICD-10-CM | POA: Diagnosis not present

## 2018-12-30 DIAGNOSIS — H2513 Age-related nuclear cataract, bilateral: Secondary | ICD-10-CM | POA: Diagnosis not present

## 2019-01-20 DIAGNOSIS — N401 Enlarged prostate with lower urinary tract symptoms: Secondary | ICD-10-CM | POA: Diagnosis not present

## 2019-01-20 DIAGNOSIS — N200 Calculus of kidney: Secondary | ICD-10-CM | POA: Diagnosis not present

## 2019-01-20 DIAGNOSIS — N23 Unspecified renal colic: Secondary | ICD-10-CM | POA: Diagnosis not present

## 2019-01-20 DIAGNOSIS — R351 Nocturia: Secondary | ICD-10-CM | POA: Diagnosis not present

## 2019-01-21 DIAGNOSIS — E1165 Type 2 diabetes mellitus with hyperglycemia: Secondary | ICD-10-CM | POA: Diagnosis not present

## 2019-01-21 DIAGNOSIS — Z125 Encounter for screening for malignant neoplasm of prostate: Secondary | ICD-10-CM | POA: Diagnosis not present

## 2019-01-21 DIAGNOSIS — Z23 Encounter for immunization: Secondary | ICD-10-CM | POA: Diagnosis not present

## 2019-02-11 DIAGNOSIS — M5441 Lumbago with sciatica, right side: Secondary | ICD-10-CM | POA: Diagnosis not present

## 2019-02-16 ENCOUNTER — Encounter: Payer: Self-pay | Admitting: Orthopaedic Surgery

## 2019-02-16 ENCOUNTER — Ambulatory Visit (INDEPENDENT_AMBULATORY_CARE_PROVIDER_SITE_OTHER): Payer: Medicare HMO | Admitting: Orthopaedic Surgery

## 2019-02-16 ENCOUNTER — Other Ambulatory Visit: Payer: Self-pay

## 2019-02-16 DIAGNOSIS — M25511 Pain in right shoulder: Secondary | ICD-10-CM | POA: Diagnosis not present

## 2019-02-16 NOTE — Progress Notes (Signed)
Office Visit Note   Patient: Mathew Lamb           Date of Birth: 06-06-1950           MRN: [id number] Visit Date: 02/16/2019              Requested by: No referring provider defined for this encounter. PCP: System, Pcp Not In   Assessment & Plan: Visit Diagnoses:  1. Trigger point of shoulder region, right     Plan: Impression is right scapular trigger point.  I demonstrated exercises for him to do at home.  We can consider injection if this does not get better.  In terms of the paresthesias that he is feeling in his axilla and the swelling I defer this to his PCP.  Follow-up as needed  Follow-Up Instructions: Return if symptoms worsen or fail to improve.   Orders:  No orders of the defined types were placed in this encounter.  No orders of the defined types were placed in this encounter.     Procedures: No procedures performed   Clinical Data: No additional findings.   Subjective: Chief Complaint  Patient presents with  . Right Upper Arm - Pain, Edema  . Left Upper Arm - Pain, Edema    Mathew Lamb comes in today for evaluation of right posterior shoulder pain.  He also is concerned about swelling in his axilla.  Denies any injuries.  Denies any numbness and tingling.  Denies constitutional symptoms.   Review of Systems  Constitutional: Negative.   All other systems reviewed and are negative.    Objective: Vital Signs: There were no vitals taken for this visit.  Physical Exam Vitals signs and nursing note reviewed.  Constitutional:      Appearance: He is well-developed.  Pulmonary:     Effort: Pulmonary effort is normal.  Abdominal:     Palpations: Abdomen is soft.  Skin:    General: Skin is warm.  Neurological:     Mental Status: He is alert and oriented to person, place, and time.  Psychiatric:        Behavior: Behavior normal.        Thought Content: Thought content normal.        Judgment: Judgment normal.     Ortho Exam Right shoulder  exam shows tenderness and painful scapular trigger point of the medial rhomboid.  He has no pain or any problems with shoulder movement or range of motion itself.  He does have noticeable swelling in his axilla. Specialty Comments:  No specialty comments available.  Imaging: No results found.   PMFS History: Patient Active Problem List   Diagnosis Date Noted  . Trigger point of shoulder region, right 02/16/2019  . Biceps tendinopathy, left 04/08/2017  . Malaise 08/19/2013  . Atypical chest pain 03/06/2012  . Chest pain 02/19/2012  . LBP (low back pain) 01/28/2012  . Healthcare maintenance 04/09/2011  . Calf pain 04/09/2011  . Smoking 04/09/2011  . UNSPECIFIED VITAMIN D DEFICIENCY 07/12/2009  . Diabetes type 2, uncontrolled (HCC) 03/02/2009  . LOW BACK PAIN, CHRONIC 07/05/2008  . DISTURBANCES OF SENSATION OF SMELL AND TASTE 08/11/2007  . LYME DISEASE 06/09/2007  . PROSTATITIS, HX OF 06/09/2007   Past Medical History:  Diagnosis Date  . Allergy   . Anxiety   . Arthritis   . Chronic low back pain   . GERD (gastroesophageal reflux disease)   . Heart murmur   . History of kidney stones ~2005  found on CT  . History of Lyme disease 1990s  . History of prostatitis   . Hypertension   . Smoker   . T2DM (type 2 diabetes mellitus) ([place]) 2010  . Vitamin D deficiency     Family History  Problem Relation Age of Onset  . Diabetes Mother   . Cirrhosis Mother   . Alcohol abuse Mother   . Pulmonary embolism Father   . Alcohol abuse Father   . Coronary artery disease Neg Hx   . Stroke Neg Hx   . Cancer Neg Hx     Past Surgical History:  Procedure Laterality Date  . EXTRACORPOREAL SHOCK WAVE LITHOTRIPSY Left 10/20/2014   Procedure: EXTRACORPOREAL SHOCK WAVE LITHOTRIPSY (ESWL);  Surgeon: Royston Cowper, MD;  Location: [facility] ORS;  Service: Urology;  Laterality: Left;  . FINGER SURGERY     right second finger   Social History   Occupational History  . Occupation: Truck  Geophysicist/field seismologist  Tobacco Use  . Smoking status: Current Every Day Smoker    Packs/day: 2.00    Years: 55.00    Pack years: 110.00    Types: Cigarettes  . Smokeless tobacco: Never Used  Substance and Sexual Activity  . Alcohol use: No    Alcohol/week: 0.0 standard drinks  . Drug use: No  . Sexual activity: Not on file

## 2019-03-09 DIAGNOSIS — R438 Other disturbances of smell and taste: Secondary | ICD-10-CM | POA: Diagnosis not present

## 2019-03-09 DIAGNOSIS — J3489 Other specified disorders of nose and nasal sinuses: Secondary | ICD-10-CM | POA: Diagnosis not present

## 2019-03-09 DIAGNOSIS — J069 Acute upper respiratory infection, unspecified: Secondary | ICD-10-CM | POA: Diagnosis not present

## 2019-03-09 DIAGNOSIS — R05 Cough: Secondary | ICD-10-CM | POA: Diagnosis not present

## 2019-03-09 DIAGNOSIS — Z20828 Contact with and (suspected) exposure to other viral communicable diseases: Secondary | ICD-10-CM | POA: Diagnosis not present

## 2019-04-21 ENCOUNTER — Other Ambulatory Visit: Payer: Self-pay | Admitting: Thoracic Surgery (Cardiothoracic Vascular Surgery)

## 2019-07-13 DIAGNOSIS — Z0000 Encounter for general adult medical examination without abnormal findings: Secondary | ICD-10-CM | POA: Diagnosis not present

## 2019-07-13 DIAGNOSIS — G4700 Insomnia, unspecified: Secondary | ICD-10-CM | POA: Diagnosis not present

## 2019-07-13 DIAGNOSIS — Z23 Encounter for immunization: Secondary | ICD-10-CM | POA: Diagnosis not present

## 2019-07-13 DIAGNOSIS — J301 Allergic rhinitis due to pollen: Secondary | ICD-10-CM | POA: Diagnosis not present

## 2019-07-13 DIAGNOSIS — E1165 Type 2 diabetes mellitus with hyperglycemia: Secondary | ICD-10-CM | POA: Diagnosis not present

## 2019-08-10 IMAGING — CR CHEST - 2 VIEW
2 series · 2 of 2 positions shown · non-contrast
Comparison: Radiographs May 16, 2012.

CLINICAL DATA: Chest pain.

EXAM:
CHEST - 2 VIEW

[w chest pa]
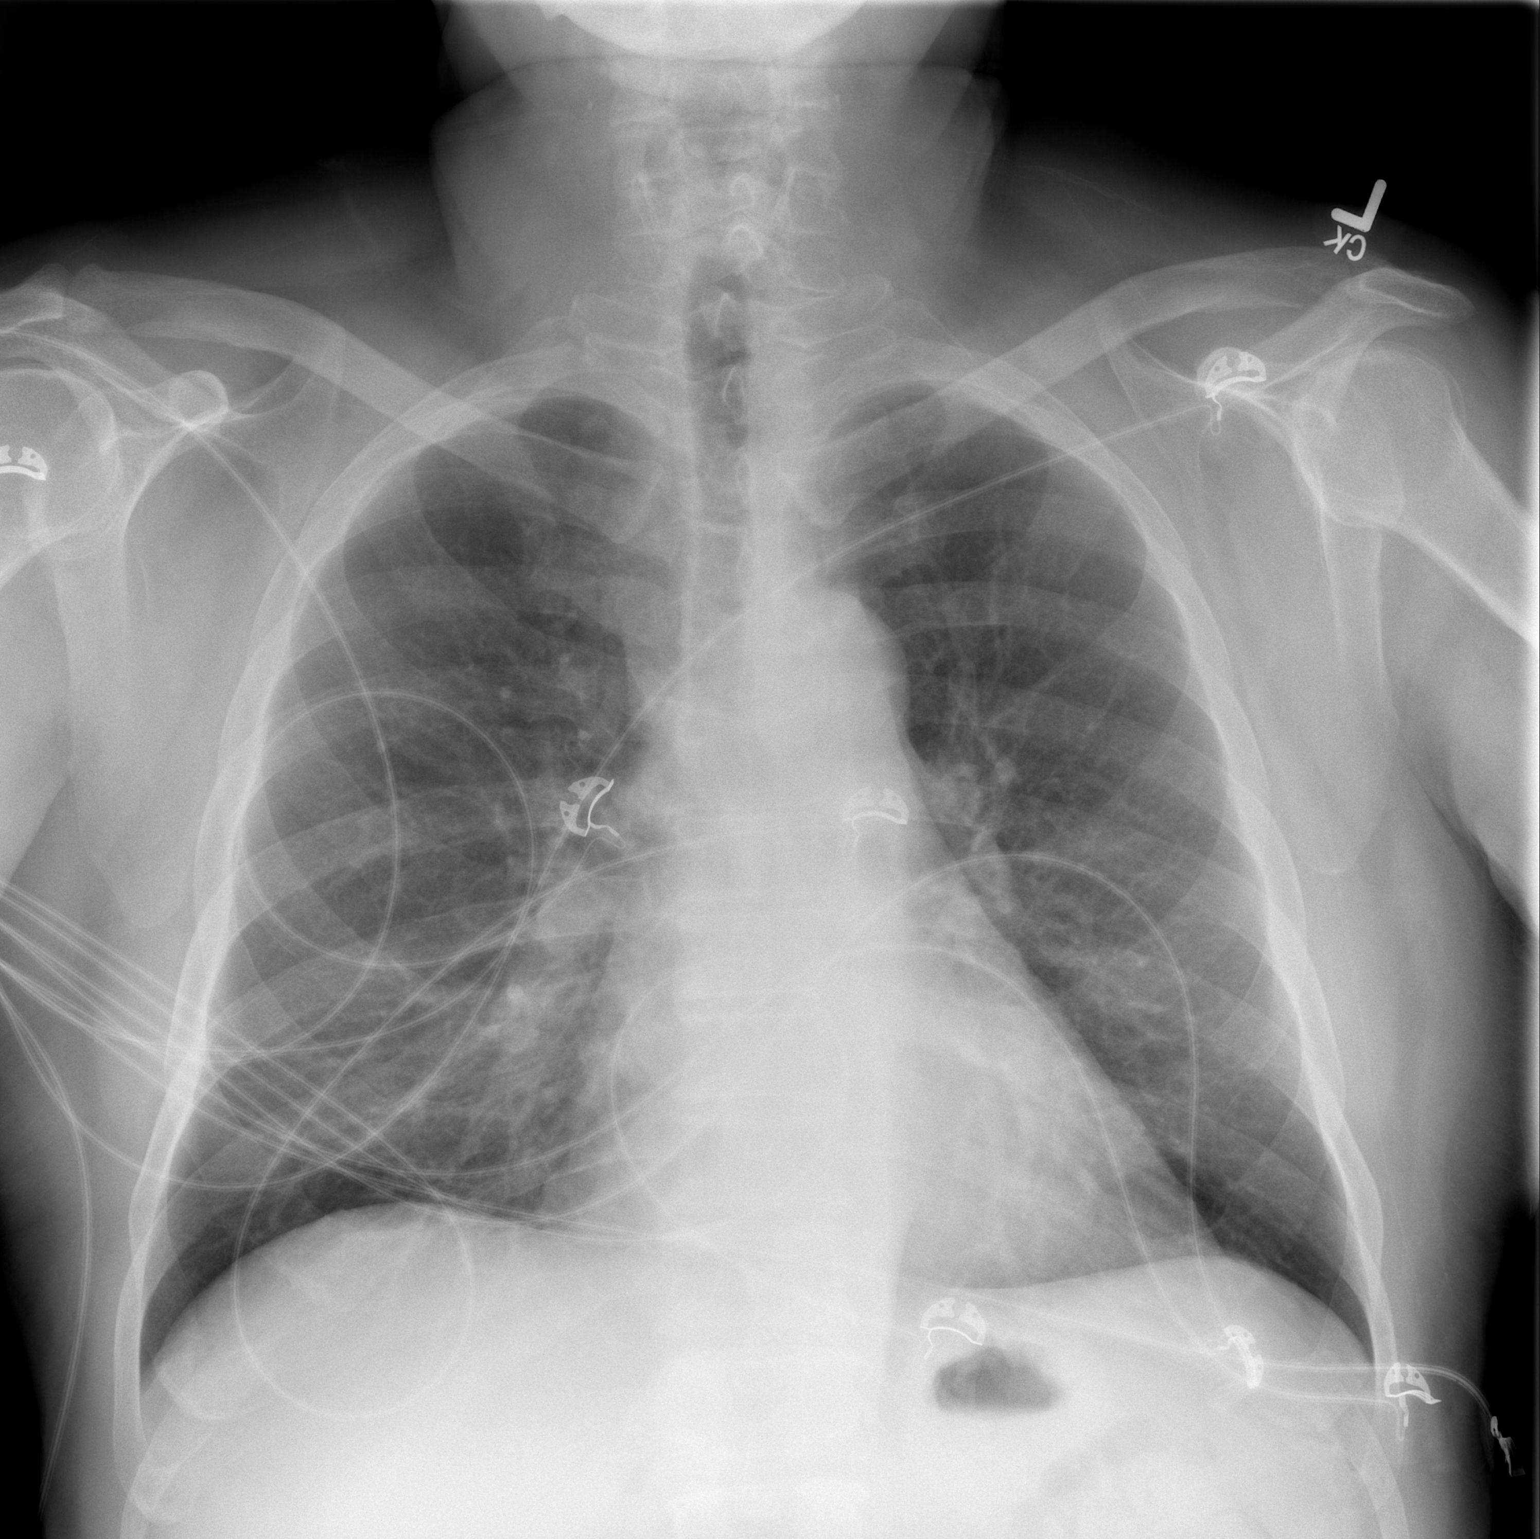

[w chest lat]
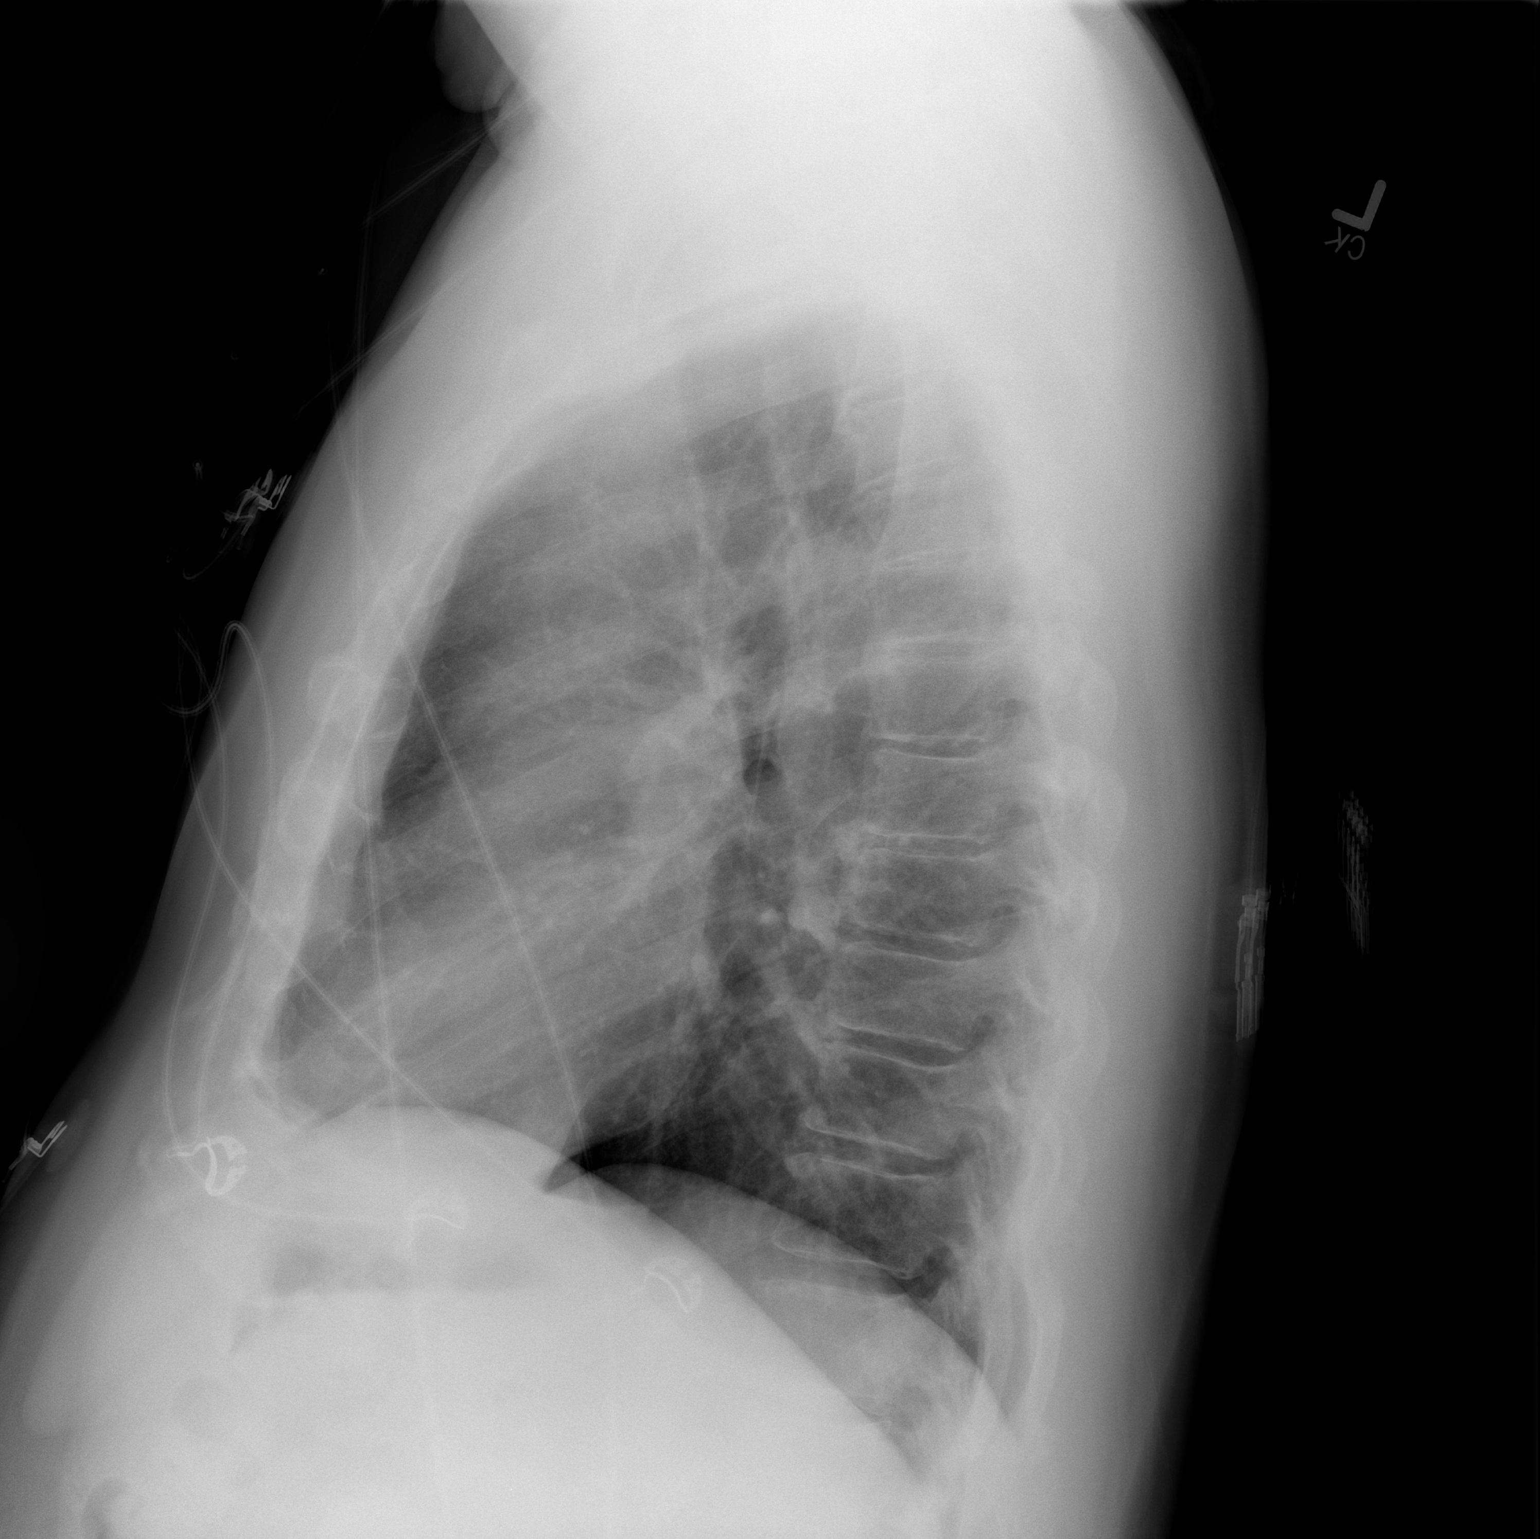

[2 of 2 positions shown; findings below may reference images not displayed]

FINDINGS: The heart size and mediastinal contours are within normal limits.
Both lungs are clear. No pneumothorax or pleural effusion is noted.
The visualized skeletal structures are unremarkable.
IMPRESSION: No active cardiopulmonary disease.

## 2019-08-20 DIAGNOSIS — E782 Mixed hyperlipidemia: Secondary | ICD-10-CM | POA: Diagnosis not present

## 2019-08-20 DIAGNOSIS — R109 Unspecified abdominal pain: Secondary | ICD-10-CM | POA: Diagnosis not present

## 2019-08-24 DIAGNOSIS — N23 Unspecified renal colic: Secondary | ICD-10-CM | POA: Diagnosis not present

## 2019-08-24 DIAGNOSIS — N201 Calculus of ureter: Secondary | ICD-10-CM | POA: Diagnosis not present

## 2019-08-24 DIAGNOSIS — N401 Enlarged prostate with lower urinary tract symptoms: Secondary | ICD-10-CM | POA: Diagnosis not present

## 2019-08-24 DIAGNOSIS — Z125 Encounter for screening for malignant neoplasm of prostate: Secondary | ICD-10-CM | POA: Diagnosis not present

## 2019-08-30 DIAGNOSIS — N201 Calculus of ureter: Secondary | ICD-10-CM | POA: Diagnosis not present

## 2019-08-30 DIAGNOSIS — N23 Unspecified renal colic: Secondary | ICD-10-CM | POA: Diagnosis not present

## 2019-08-30 DIAGNOSIS — N401 Enlarged prostate with lower urinary tract symptoms: Secondary | ICD-10-CM | POA: Diagnosis not present

## 2019-08-30 DIAGNOSIS — Z125 Encounter for screening for malignant neoplasm of prostate: Secondary | ICD-10-CM | POA: Diagnosis not present

## 2019-09-01 DIAGNOSIS — R109 Unspecified abdominal pain: Secondary | ICD-10-CM | POA: Diagnosis not present

## 2019-09-17 ENCOUNTER — Encounter: Payer: Self-pay | Admitting: Orthopaedic Surgery

## 2019-09-17 ENCOUNTER — Ambulatory Visit: Payer: Medicare HMO | Admitting: Orthopaedic Surgery

## 2019-09-17 ENCOUNTER — Other Ambulatory Visit: Payer: Self-pay

## 2019-09-17 ENCOUNTER — Ambulatory Visit: Payer: Self-pay

## 2019-09-17 DIAGNOSIS — M25551 Pain in right hip: Secondary | ICD-10-CM | POA: Diagnosis not present

## 2019-09-17 MED ORDER — PREDNISONE 10 MG (21) PO TBPK: ORAL_TABLET | ORAL | 0 refills | Status: DC

## 2019-09-17 MED ORDER — OXYCODONE-ACETAMINOPHEN 5-325 MG PO TABS: 1.0000 | ORAL_TABLET | Freq: Every day | ORAL | 0 refills | Status: DC | PRN

## 2019-09-18 NOTE — Progress Notes (Signed)
Office Visit Note   Patient: Mathew Lamb           Date of Birth: 1951-03-01           MRN: [id number] Visit Date: 09/17/2019              Requested by: No referring provider defined for this encounter. PCP: System, Pcp Not In   Assessment & Plan: Visit Diagnoses:  1. Pain in right hip     Plan: Certainly his presentation is not classic for an orthopedic condition.  He did have a CT scan which was negative for kidney stones. I am not exactly sure what to make of his symptomatology.  I do not feel that it is coming from his hip joint or his back.  I went ahead and prescribed a prednisone Dosepak and Percocet to help with the pain and hopefully this will resolve it.  If he continues to have problems he should follow-up with either his urologist or PCP.  Questions encouraged and answered.  Follow-Up Instructions: Return if symptoms worsen or fail to improve.   Orders:  Orders Placed This Encounter  Procedures  . XR HIP UNILAT W OR W/O PELVIS 2-3 VIEWS RIGHT   Meds ordered this encounter  Medications  . predniSONE (STERAPRED UNI-PAK 21 TAB) 10 MG (21) TBPK tablet    Sig: Take as directed    Dispense:  21 tablet    Refill:  0  . oxyCODONE-acetaminophen (PERCOCET) 5-325 MG tablet    Sig: Take 1-2 tablets by mouth daily as needed for severe pain.    Dispense:  10 tablet    Refill:  0      Procedures: No procedures performed   Clinical Data: No additional findings.   Subjective: Chief Complaint  Patient presents with  . Right Hip - Pain    Mathew Lamb is a 69 year old gentleman who is well-known to me who comes in for 34months right hip and flank pain.  He states that he has had stabbing groin pain with burning.  This has been getting worse for the last month.  He states that the pain is just above his pelvic brim and feels very reminiscent of his previous kidney stones.  He denies any true groin pain.  He denies any back pain or radicular pain.  He denies any recent  injuries or surgeries.   Review of Systems  Constitutional: Negative.   All other systems reviewed and are negative.    Objective: Vital Signs: There were no vitals taken for this visit.  Physical Exam Vitals and nursing note reviewed.  Constitutional:      Appearance: He is well-developed.  Pulmonary:     Effort: Pulmonary effort is normal.  Abdominal:     Palpations: Abdomen is soft.  Skin:    General: Skin is warm.  Neurological:     Mental Status: He is alert and oriented to person, place, and time.  Psychiatric:        Behavior: Behavior normal.        Thought Content: Thought content normal.        Judgment: Judgment normal.     Ortho Exam Right hip shows painless logroll and internal and external rotation.  His skin is very sensitive to touch proximal to the iliac wing.  His right hip is nontender to palpation. Specialty Comments:  No specialty comments available.  Imaging: XR HIP UNILAT W OR W/O PELVIS 2-3 VIEWS RIGHT  Result Date: 09/18/2019 Mild  osteoarthritis    PMFS History: Patient Active Problem List   Diagnosis Date Noted  . Trigger point of shoulder region, right 02/16/2019  . Biceps tendinopathy, left 04/08/2017  . Malaise 08/19/2013  . Atypical chest pain 03/06/2012  . Chest pain 02/19/2012  . LBP (low back pain) 01/28/2012  . Healthcare maintenance 04/09/2011  . Calf pain 04/09/2011  . Smoking 04/09/2011  . UNSPECIFIED VITAMIN D DEFICIENCY 07/12/2009  . Diabetes type 2, uncontrolled ([place]) 03/02/2009  . LOW BACK PAIN, CHRONIC 07/05/2008  . DISTURBANCES OF SENSATION OF SMELL AND TASTE 08/11/2007  . LYME DISEASE 06/09/2007  . PROSTATITIS, HX OF 06/09/2007   Past Medical History:  Diagnosis Date  . Allergy   . Anxiety   . Arthritis   . Chronic low back pain   . GERD (gastroesophageal reflux disease)   . Heart murmur   . History of kidney stones ~2005   found on CT  . History of Lyme disease 1990s  . History of prostatitis   .  Hypertension   . Smoker   . T2DM (type 2 diabetes mellitus) ([place]) 2010  . Vitamin D deficiency     Family History  Problem Relation Age of Onset  . Diabetes Mother   . Cirrhosis Mother   . Alcohol abuse Mother   . Pulmonary embolism Father   . Alcohol abuse Father   . Coronary artery disease Neg Hx   . Stroke Neg Hx   . Cancer Neg Hx     Past Surgical History:  Procedure Laterality Date  . EXTRACORPOREAL SHOCK WAVE LITHOTRIPSY Left 10/20/2014   Procedure: EXTRACORPOREAL SHOCK WAVE LITHOTRIPSY (ESWL);  Surgeon: Royston Cowper, MD;  Location: [facility] ORS;  Service: Urology;  Laterality: Left;  . FINGER SURGERY     right second finger   Social History   Occupational History  . Occupation: Truck Geophysicist/field seismologist  Tobacco Use  . Smoking status: Current Every Day Smoker    Packs/day: 2.00    Years: 55.00    Pack years: 110.00    Types: Cigarettes  . Smokeless tobacco: Never Used  Substance and Sexual Activity  . Alcohol use: No    Alcohol/week: 0.0 standard drinks  . Drug use: No  . Sexual activity: Not on file

## 2020-08-16 ENCOUNTER — Other Ambulatory Visit: Payer: Self-pay | Admitting: Neurosurgery

## 2020-08-16 DIAGNOSIS — M48062 Spinal stenosis, lumbar region with neurogenic claudication: Secondary | ICD-10-CM

## 2020-08-23 ENCOUNTER — Other Ambulatory Visit (HOSPITAL_COMMUNITY): Payer: Self-pay | Admitting: Neurosurgery

## 2020-08-23 ENCOUNTER — Ambulatory Visit (HOSPITAL_COMMUNITY)
Admission: RE | Admit: 2020-08-23 | Discharge: 2020-08-23 | Disposition: A | Discharge status: Home / Self Care | Payer: Medicare HMO | Source: Ambulatory Visit | Attending: Neurosurgery | Admitting: Neurosurgery

## 2020-08-23 ENCOUNTER — Other Ambulatory Visit: Payer: Self-pay

## 2020-08-23 DIAGNOSIS — I739 Peripheral vascular disease, unspecified: Secondary | ICD-10-CM | POA: Diagnosis present

## 2020-08-30 ENCOUNTER — Ambulatory Visit: Payer: Medicare HMO | Admitting: Physical Therapy

## 2020-09-03 ENCOUNTER — Ambulatory Visit
Admission: RE | Admit: 2020-09-03 | Discharge: 2020-09-03 | Disposition: A | Discharge status: Home / Self Care | Payer: Medicare HMO | Source: Ambulatory Visit | Attending: Neurosurgery | Admitting: Neurosurgery

## 2020-09-03 DIAGNOSIS — M48062 Spinal stenosis, lumbar region with neurogenic claudication: Secondary | ICD-10-CM

## 2020-09-05 ENCOUNTER — Ambulatory Visit: Payer: Medicare HMO | Admitting: Physical Therapy

## 2020-09-05 ENCOUNTER — Other Ambulatory Visit: Payer: Self-pay

## 2020-09-05 ENCOUNTER — Encounter: Payer: Self-pay | Admitting: Physical Therapy

## 2020-09-05 ENCOUNTER — Ambulatory Visit: Payer: Medicare HMO | Attending: Neurosurgery | Admitting: Physical Therapy

## 2020-09-05 DIAGNOSIS — M5442 Lumbago with sciatica, left side: Secondary | ICD-10-CM | POA: Diagnosis not present

## 2020-09-05 DIAGNOSIS — M5441 Lumbago with sciatica, right side: Secondary | ICD-10-CM | POA: Diagnosis present

## 2020-09-05 DIAGNOSIS — G8929 Other chronic pain: Secondary | ICD-10-CM | POA: Insufficient documentation

## 2020-09-05 DIAGNOSIS — R262 Difficulty in walking, not elsewhere classified: Secondary | ICD-10-CM | POA: Diagnosis present
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
## 2020-09-11 ENCOUNTER — Ambulatory Visit: Payer: Medicare HMO | Admitting: Physical Therapy

## 2020-09-13 ENCOUNTER — Ambulatory Visit: Payer: Medicare HMO | Admitting: Physical Therapy

## 2020-09-18 ENCOUNTER — Encounter: Payer: Medicare HMO | Admitting: Physical Therapy

## 2020-09-20 ENCOUNTER — Encounter: Payer: Medicare HMO | Admitting: Physical Therapy

## 2020-09-25 ENCOUNTER — Encounter: Payer: Medicare HMO | Admitting: Physical Therapy

## 2020-09-27 ENCOUNTER — Encounter: Payer: Medicare HMO | Admitting: Physical Therapy

## 2020-10-04 ENCOUNTER — Encounter: Payer: Medicare HMO | Admitting: Physical Therapy

## 2020-12-29 ENCOUNTER — Other Ambulatory Visit: Payer: Self-pay | Admitting: Urology

## 2020-12-29 DIAGNOSIS — R310 Gross hematuria: Secondary | ICD-10-CM

## 2021-01-16 ENCOUNTER — Ambulatory Visit: Admission: RE | Admit: 2021-01-16 | Payer: Medicare HMO | Source: Ambulatory Visit

## 2021-01-18 ENCOUNTER — Other Ambulatory Visit: Payer: Self-pay | Admitting: Urology

## 2021-01-18 DIAGNOSIS — R972 Elevated prostate specific antigen [PSA]: Secondary | ICD-10-CM

## 2021-01-31 ENCOUNTER — Ambulatory Visit
Admission: RE | Admit: 2021-01-31 | Discharge: 2021-01-31 | Disposition: A | Discharge status: Home / Self Care | Payer: Medicare HMO | Source: Ambulatory Visit | Attending: Urology | Admitting: Urology

## 2021-01-31 ENCOUNTER — Other Ambulatory Visit: Payer: Self-pay

## 2021-01-31 DIAGNOSIS — R310 Gross hematuria: Secondary | ICD-10-CM | POA: Insufficient documentation

## 2021-01-31 LAB — POCT I-STAT CREATININE: Creatinine, Ser: 0.7 mg/dL (ref 0.61–1.24)

## 2021-01-31 MED ORDER — IOHEXOL 350 MG/ML SOLN
125.0000 mL | Freq: Once | INTRAVENOUS | Status: AC | PRN
  Administered 2021-01-31: 125 mL via INTRAVENOUS

## 2021-02-02 ENCOUNTER — Ambulatory Visit
Admission: RE | Admit: 2021-02-02 | Discharge: 2021-02-02 | Disposition: A | Discharge status: Home / Self Care | Payer: Medicare HMO | Source: Ambulatory Visit | Attending: Urology | Admitting: Urology

## 2021-02-02 ENCOUNTER — Other Ambulatory Visit: Payer: Self-pay

## 2021-02-02 DIAGNOSIS — R972 Elevated prostate specific antigen [PSA]: Secondary | ICD-10-CM | POA: Insufficient documentation

## 2021-02-02 MED ORDER — GADOBUTROL 1 MMOL/ML IV SOLN
9.0000 mL | Freq: Once | INTRAVENOUS | Status: AC | PRN
  Administered 2021-02-02: 9 mL via INTRAVENOUS

## 2021-02-02 MED ORDER — GADOBENATE DIMEGLUMINE 529 MG/ML IV SOLN: 9.0000 mL | Freq: Once | INTRAVENOUS | Status: DC | PRN

## 2021-02-21 ENCOUNTER — Inpatient Hospital Stay: Admission: RE | Admit: 2021-02-21 | Payer: Medicare HMO | Source: Ambulatory Visit

## 2021-02-22 ENCOUNTER — Encounter: Admission: RE | Payer: Self-pay | Source: Home / Self Care

## 2021-02-22 ENCOUNTER — Ambulatory Visit: Admission: RE | Admit: 2021-02-22 | Payer: Medicare HMO | Source: Home / Self Care | Admitting: Urology

## 2021-02-22 SURGERY — BIOPSY, PROSTATE
Anesthesia: Choice

## 2022-01-31 ENCOUNTER — Other Ambulatory Visit: Payer: Self-pay | Admitting: *Deleted

## 2022-01-31 DIAGNOSIS — I739 Peripheral vascular disease, unspecified: Secondary | ICD-10-CM

## 2022-02-11 ENCOUNTER — Ambulatory Visit: Payer: Medicare HMO | Admitting: Surgery

## 2022-02-11 ENCOUNTER — Ambulatory Visit (HOSPITAL_COMMUNITY)
Admission: RE | Admit: 2022-02-11 | Discharge: 2022-02-11 | Disposition: A | Discharge status: Home / Self Care | Payer: Medicare HMO | Source: Ambulatory Visit | Attending: Surgery | Admitting: Surgery

## 2022-02-11 ENCOUNTER — Encounter: Payer: Self-pay | Admitting: Surgery

## 2022-02-11 VITALS — BP 129/69 | HR 87 | Temp 97.9°F | Resp 20 | Ht 66.0 in | Wt 215.7 lb

## 2022-02-11 DIAGNOSIS — I70213 Atherosclerosis of native arteries of extremities with intermittent claudication, bilateral legs: Secondary | ICD-10-CM

## 2022-02-11 DIAGNOSIS — I739 Peripheral vascular disease, unspecified: Secondary | ICD-10-CM

## 2022-02-11 MED ORDER — CILOSTAZOL 100 MG PO TABS: 100.0000 mg | ORAL_TABLET | Freq: Two times a day (BID) | ORAL | 11 refills | Status: DC

## 2022-02-11 NOTE — Progress Notes (Signed)
[facility] [place]  Patient name: Mathew Lamb MRN: [id number] DOB: [birth year] Sex: male   REQUESTING PROVIDER:    Dr. Jon Billings   REASON FOR CONSULT:    PAD  HISTORY OF PRESENT ILLNESS:   Mathew Lamb is a 71 y.o. male, who is referred for evaluation of claudication.  The patient states that he began having symptoms approximately a year and a half ago.  He describes with moderate activity that he will develop bilateral calf cramping as well as fatigue.  His symptoms will go away with rest.  He has learned to limit his activity because of his symptoms.  The patient suffers from diabetes.  He is medically managed for hypertension.  He has hypercholesterolemia but has been intolerant of statins.  He is a smoker.  PAST MEDICAL HISTORY    Past Medical History:  Diagnosis Date   Allergy    Anxiety    Arthritis    Chronic low back pain    GERD (gastroesophageal reflux disease)    Heart murmur    History of kidney stones ~2005   found on CT   History of Lyme disease 1990s   History of prostatitis    Hypertension    Smoker    T2DM (type 2 diabetes mellitus) (HCC) 2010   Vitamin D deficiency      FAMILY HISTORY   Family History  Problem Relation Age of Onset   Diabetes Mother    Cirrhosis Mother    Alcohol abuse Mother    Pulmonary embolism Father    Alcohol abuse Father    Coronary artery disease Neg Hx    Stroke Neg Hx    Cancer Neg Hx     SOCIAL HISTORY:   Social History   Socioeconomic History   Marital status: Married    Spouse name: Not on file   Number of children: 1   Years of education: Not on file   Highest education level: Not on file  Occupational History   Occupation: Truck driver  Tobacco Use   Smoking status: Every Day    Packs/day: 2.00    Years: 55.00    Total pack years: 110.00    Types: Cigarettes   Smokeless tobacco: Never  Substance and Sexual Activity   Alcohol use:  No    Alcohol/week: 0.0 standard drinks of alcohol   Drug use: No   Sexual activity: Not on file  Other Topics Concern   Not on file  Social History Narrative   Caffeine: 3 sodas/day (diet Mt Dew)   Lives with wife, 2 dogs   3rd marriage x 10 years (grown children)   Occupation: Truck [employer] doctor   Activity: hunts   Diet: daily fruits/vegetables, no fish, red meat daily   Social Determinants of Corporate investment banker Strain: Not on file  Food Insecurity: Not on file  Transportation Needs: Not on file  Physical Activity: Not on file  Stress: Not on file  Social Connections: Not on file  Intimate Partner Violence: Not on file    ALLERGIES:    Allergies  Allergen Reactions   Actos [Pioglitazone Hydrochloride]     Insomnia due to nocturia   Cephalexin Swelling    REACTION: UNSPECIFIED   Ciprofloxacin     REACTION: GI UPSET   Codeine     REACTION: RASH   Cyclobenzaprine Hcl     REACTION: HIVES   Diclofenac Sodium     REACTION: GI UPSET  Hydrocodone Swelling    Throat swells    Meperidine Hcl     REACTION: SWELLING   Meperidine Hcl    Metformin Diarrhea   Penicillins     REACTION: SHORT OF BREATH   Pentazocine Lactate     REACTION: UNSPECIFIED    CURRENT MEDICATIONS:    Current Outpatient Medications  Medication Sig Dispense Refill   ALPRAZolam (XANAX) 0.5 MG tablet Take 0.5 mg by mouth daily as needed.     gabapentin (NEURONTIN) 300 MG capsule Take 300 mg by mouth daily.     glimepiride (AMARYL) 4 MG tablet Take 4 mg by mouth 2 (two) times daily.     losartan (COZAAR) 25 MG tablet Take 25 mg by mouth daily.     pioglitazone (ACTOS) 45 MG tablet Take 45 mg by mouth daily.     tamsulosin (FLOMAX) 0.4 MG CAPS capsule Take 1 capsule (0.4 mg total) by mouth daily. 30 capsule 11   acetaminophen (TYLENOL) 500 MG tablet Take 1,000 mg by mouth every 6 (six) hours as needed for pain. Reported on 09/06/2015 (Patient not taking: Reported on 02/11/2022)     cetirizine  (ZYRTEC) 10 MG tablet Take 10 mg by mouth daily. (Patient not taking: Reported on 02/11/2022)     diclofenac sodium (VOLTAREN) 1 % GEL Apply 2 g topically 4 (four) times daily. (Patient not taking: Reported on 02/11/2022) 1 Tube 5   No current facility-administered medications for this visit.    REVIEW OF SYSTEMS:   [X]  denotes positive finding, [ ]  denotes negative finding Cardiac  Comments:  Chest pain or chest pressure:    Shortness of breath upon exertion:    Short of breath when lying flat:    Irregular heart rhythm:        Vascular    Pain in calf, thigh, or hip brought on by ambulation: x   Pain in feet at night that wakes you up from your sleep:     Blood clot in your veins:    Leg swelling:         Pulmonary    Oxygen at home:    Productive cough:     Wheezing:         Neurologic    Sudden weakness in arms or legs:     Sudden numbness in arms or legs:     Sudden onset of difficulty speaking or slurred speech:    Temporary loss of vision in one eye:     Problems with dizziness:         Gastrointestinal    Blood in stool:      Vomited blood:         Genitourinary    Burning when urinating:     Blood in urine:        Psychiatric    Major depression:         Hematologic    Bleeding problems:    Problems with blood clotting too easily:        Skin    Rashes or ulcers:        Constitutional    Fever or chills:     PHYSICAL EXAM:   Vitals:   02/11/22 1110  BP: 129/69  Pulse: 87  Resp: 20  Temp: 97.9 F (36.6 C)  SpO2: 95%  Weight: 215 lb 11.2 oz (97.8 kg)  Height: 5\' 6"  (1.676 m)    GENERAL: The patient is a well-nourished male, in no acute distress. The  vital signs are documented above. CARDIAC: There is a regular rate and rhythm.  VASCULAR: Palpable femoral pulses bilaterally.  Nonpalpable popliteal and pedal pulses PULMONARY: Nonlabored respirations ABDOMEN: Soft and non-tender with normal pitched bowel sounds.  MUSCULOSKELETAL: There are  no major deformities or cyanosis. NEUROLOGIC: No focal weakness or paresthesias are detected. SKIN: There are no ulcers or rashes noted. PSYCHIATRIC: The patient has a normal affect.  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  The most important aspect of care is going to be smoking cessation.  Next I discussed proceeding with a exercise program with daily walking.  I would like for him to be on a statin however he is intolerant, and so we may need to consider Repatha.  Finally, I am starting him on cilostazol.  He will follow-up with me in 3 months to see how he is doing.   Leia Alf, MD, FACS [facility] [phone] Pager [phone]

## 2022-05-20 ENCOUNTER — Encounter: Payer: Self-pay | Admitting: Surgery

## 2022-05-20 ENCOUNTER — Ambulatory Visit: Payer: Medicare HMO | Admitting: Surgery

## 2022-05-20 VITALS — BP 130/70 | HR 78 | Temp 98.0°F | Resp 20 | Ht 66.0 in | Wt 217.0 lb

## 2022-05-20 DIAGNOSIS — I70213 Atherosclerosis of native arteries of extremities with intermittent claudication, bilateral legs: Secondary | ICD-10-CM | POA: Diagnosis not present

## 2022-05-20 NOTE — Progress Notes (Signed)
[facility] and [facility]in Specialist of [place]  Patient name: Mathew Lamb MRN: [id number] DOB: [birth year] Sex: male   REASON FOR VISIT:    Follow up  HISOTRY OF PRESENT ILLNESS:    Mathew Lamb is a 72 y.o. male, who is referred for evaluation of claudication.  The patient states that he began having symptoms approximately a year and a half ago.  He describes with moderate activity that he will develop bilateral calf cramping as well as fatigue.  His symptoms will go away with rest.  He has learned to limit his activity because of his symptoms.  At his initial visit we stressed the importance of smoking cessation.  We talked about an exercise program with daily walking.  He was given a trial of cilostazol.  We also talked about referral to the clinic for evaluation of Repatha given his statin intolerance  He tells me his diabetes is not very well-controlled.  He was able to cut back from 2 packs of smoking a day to 1 pack.  He did not feel that the cilostazol helped him and so he discontinued it.   The patient suffers from diabetes.  He is medically managed for hypertension.  He has hypercholesterolemia but has been intolerant of statins.  He is a smoker.   PAST MEDICAL HISTORY:   Past Medical History:  Diagnosis Date   Allergy    Anxiety    Arthritis    Chronic low back pain    GERD (gastroesophageal reflux disease)    Heart murmur    History of kidney stones ~2005   found on CT   History of Lyme disease 1990s   History of prostatitis    Hypertension    Smoker    T2DM (type 2 diabetes mellitus) (HCC) 2010   Vitamin D deficiency      FAMILY HISTORY:   Family History  Problem Relation Age of Onset   Diabetes Mother    Cirrhosis Mother    Alcohol abuse Mother    Pulmonary embolism Father    Alcohol abuse Father    Coronary artery disease Neg Hx    Stroke Neg Hx    Cancer Neg Hx     SOCIAL HISTORY:   Social History    Tobacco Use   Smoking status: Every Day    Packs/day: 1.00    Years: 55.00    Total pack years: 55.00    Types: Cigarettes   Smokeless tobacco: Never  Substance Use Topics   Alcohol use: No    Alcohol/week: 0.0 standard drinks of alcohol     ALLERGIES:   Allergies  Allergen Reactions   Actos [Pioglitazone Hydrochloride]     Insomnia due to nocturia   Cephalexin Swelling    REACTION: UNSPECIFIED   Ciprofloxacin     REACTION: GI UPSET   Codeine     REACTION: RASH   Cyclobenzaprine Hcl     REACTION: HIVES   Diclofenac Sodium     REACTION: GI UPSET   Hydrocodone Swelling    Throat swells    Meperidine Hcl     REACTION: SWELLING   Meperidine Hcl    Metformin Diarrhea   Penicillins     REACTION: SHORT OF BREATH   Pentazocine Lactate     REACTION: UNSPECIFIED     CURRENT MEDICATIONS:   Current Outpatient Medications  Medication Sig Dispense Refill   acetaminophen (TYLENOL) 500 MG tablet Take 1,000 mg by mouth every 6 (six) hours as  needed for pain. Reported on 09/06/2015     ALPRAZolam (XANAX) 0.5 MG tablet Take 0.5 mg by mouth daily as needed.     cetirizine (ZYRTEC) 10 MG tablet Take 10 mg by mouth daily.     cilostazol (PLETAL) 100 MG tablet Take 1 tablet (100 mg total) by mouth 2 (two) times daily before a meal. 60 tablet 11   diclofenac sodium (VOLTAREN) 1 % GEL Apply 2 g topically 4 (four) times daily. 1 Tube 5   gabapentin (NEURONTIN) 300 MG capsule Take 300 mg by mouth daily.     glimepiride (AMARYL) 4 MG tablet Take 4 mg by mouth 2 (two) times daily.     losartan (COZAAR) 25 MG tablet Take 25 mg by mouth daily.     pioglitazone (ACTOS) 45 MG tablet Take 45 mg by mouth daily.     tamsulosin (FLOMAX) 0.4 MG CAPS capsule Take 1 capsule (0.4 mg total) by mouth daily. 30 capsule 11   No current facility-administered medications for this visit.    REVIEW OF SYSTEMS:   [X]  denotes positive finding, [ ]  denotes negative finding Cardiac  Comments:  Chest  pain or chest pressure:    Shortness of breath upon exertion:    Short of breath when lying flat:    Irregular heart rhythm:        Vascular    Pain in calf, thigh, or hip brought on by ambulation:    Pain in feet at night that wakes you up from your sleep:     Blood clot in your veins:    Leg swelling:         Pulmonary    Oxygen at home:    Productive cough:     Wheezing:         Neurologic    Sudden weakness in arms or legs:     Sudden numbness in arms or legs:     Sudden onset of difficulty speaking or slurred speech:    Temporary loss of vision in one eye:     Problems with dizziness:         Gastrointestinal    Blood in stool:     Vomited blood:         Genitourinary    Burning when urinating:     Blood in urine:        Psychiatric    Major depression:         Hematologic    Bleeding problems:    Problems with blood clotting too easily:        Skin    Rashes or ulcers:        Constitutional    Fever or chills:      PHYSICAL EXAM:   Vitals:   05/20/22 1059  BP: 130/70  Pulse: 78  Resp: 20  Temp: 98 F (36.7 C)  SpO2: 96%  Weight: 217 lb (98.4 kg)  Height: 5\' 6"  (1.676 m)    GENERAL: The patient is a well-nourished male, in no acute distress. The vital signs are documented above. CARDIAC: There is a regular rate and rhythm.  VASCULAR: Palpable radial pulses bilaterally.  Palpable pedal pulses PULMONARY: Non-labored respirations MUSCULOSKELETAL: There are no major deformities or cyanosis. NEUROLOGIC: No focal weakness or paresthesias are detected. SKIN: There are no ulcers or rashes noted. PSYCHIATRIC: The patient has a normal affect.  STUDIES:   None  MEDICAL ISSUES:   Claudication: I discussed with the patient that I would  not consider intervention as long as his diabetes is poorly controlled and he continues to smoke a pack a day.  I have stressed the importance of an exercise program and for him to walk is much as possible.  I have him  scheduled to follow-up with me in 1 year.  He knows to contact me should he develop a nonhealing wound on his foot, which he is going to evaluate nightly.    Leia Alf, MD, FACS [facility] [phone] Pager [phone]

## 2022-11-11 LAB — COLOGUARD: COLOGUARD: NEGATIVE

## 2023-04-23 ENCOUNTER — Emergency Department (HOSPITAL_BASED_OUTPATIENT_CLINIC_OR_DEPARTMENT_OTHER): Payer: Medicare HMO

## 2023-04-23 ENCOUNTER — Encounter (HOSPITAL_BASED_OUTPATIENT_CLINIC_OR_DEPARTMENT_OTHER): Payer: Self-pay | Admitting: Emergency Medicine

## 2023-04-23 ENCOUNTER — Emergency Department (HOSPITAL_BASED_OUTPATIENT_CLINIC_OR_DEPARTMENT_OTHER)
Admission: EM | Admit: 2023-04-23 | Discharge: 2023-04-23 | Disposition: A | Discharge status: Home / Self Care | Payer: Medicare HMO | Attending: Emergency Medicine | Admitting: Emergency Medicine

## 2023-04-23 ENCOUNTER — Other Ambulatory Visit: Payer: Self-pay

## 2023-04-23 DIAGNOSIS — E119 Type 2 diabetes mellitus without complications: Secondary | ICD-10-CM | POA: Insufficient documentation

## 2023-04-23 DIAGNOSIS — B029 Zoster without complications: Secondary | ICD-10-CM | POA: Insufficient documentation

## 2023-04-23 DIAGNOSIS — Z87442 Personal history of urinary calculi: Secondary | ICD-10-CM | POA: Insufficient documentation

## 2023-04-23 DIAGNOSIS — R109 Unspecified abdominal pain: Secondary | ICD-10-CM | POA: Diagnosis present

## 2023-04-23 LAB — COMPREHENSIVE METABOLIC PANEL
ALT: 15 U/L (ref 0–44)
AST: 12 U/L — ABNORMAL LOW (ref 15–41)
Albumin: 4.2 g/dL (ref 3.5–5.0)
Alkaline Phosphatase: 63 U/L (ref 38–126)
Anion gap: 9 (ref 5–15)
BUN: 17 mg/dL (ref 8–23)
CO2: 26 mmol/L (ref 22–32)
Calcium: 9.5 mg/dL (ref 8.9–10.3)
Chloride: 104 mmol/L (ref 98–111)
Creatinine, Ser: 0.95 mg/dL (ref 0.61–1.24)
GFR, Estimated: >60 mL/min (ref >60)
Glucose, Bld: 155 mg/dL — ABNORMAL HIGH (ref 70–99)
Potassium: 3.9 mmol/L (ref 3.5–5.1)
Sodium: 139 mmol/L (ref 135–145)
Total Bilirubin: 0.3 mg/dL (ref <1.2)
Total Protein: 6.9 g/dL (ref 6.5–8.1)

## 2023-04-23 LAB — CBC
HCT: 42.8 % (ref 39.0–52.0)
Hemoglobin: 14.2 g/dL (ref 13.0–17.0)
MCH: 31.6 pg (ref 26.0–34.0)
MCHC: 33.2 g/dL (ref 30.0–36.0)
MCV: 95.1 fL (ref 80.0–100.0)
Platelets: 228 10*3/uL (ref 150–400)
RBC: 4.5 MIL/uL (ref 4.22–5.81)
RDW: 13.4 % (ref 11.5–15.5)
WBC: 8.3 10*3/uL (ref 4.0–10.5)
nRBC: 0 % (ref 0.0–0.2)

## 2023-04-23 LAB — URINALYSIS, ROUTINE W REFLEX MICROSCOPIC
Bilirubin Urine: NEGATIVE
Glucose, UA: NEGATIVE mg/dL
Hgb urine dipstick: NEGATIVE
Ketones, ur: NEGATIVE mg/dL
Leukocytes,Ua: NEGATIVE
Nitrite: NEGATIVE
Protein, ur: NEGATIVE mg/dL
Specific Gravity, Urine: 1.017 (ref 1.005–1.030)
pH: 6.5 (ref 5.0–8.0)

## 2023-04-23 LAB — LIPASE, BLOOD: Lipase: 29 U/L (ref 11–51)

## 2023-04-23 MED ORDER — VALACYCLOVIR HCL 1 G PO TABS: 1000.0000 mg | ORAL_TABLET | Freq: Three times a day (TID) | ORAL | 0 refills | Status: AC

## 2023-04-23 NOTE — ED Provider Notes (Signed)
[place] EMERGENCY DEPARTMENT AT [facility] Provider Note   CSN: [id number] Arrival date & time: 04/23/23  2035     History  Chief Complaint  Patient presents with   Flank Pain    Mathew Lamb is a 72 y.o. male history of kidney stones, diabetes, GERD presented with left flank pain that began earlier today.  Patient states that the pain comes around his left flank and was a burning fashion and that his skin feels sensitive but denies rash.  Patient denies nausea vomiting, dysuria hematuria, abdominal pain, fevers.  Patient states that he has not had a shingles shot.  Patient states this feels similar to previous kidney stones association sharp burning sensation.  Patient states has been under a lot of stress trying to take care of his brother who is at end-of-life.  Patient denies shortness of breath, cough, chest pain.   Home Medications Prior to Admission medications   Medication Sig Start Date End Date Taking? Authorizing Provider  valACYclovir (VALTREX) 1000 MG tablet Take 1 tablet (1,000 mg total) by mouth 3 (three) times daily for 7 days. 04/23/23 04/30/23 Yes Carleena Mires, Beverly Gust, PA-C  acetaminophen (TYLENOL) 500 MG tablet Take 1,000 mg by mouth every 6 (six) hours as needed for pain. Reported on 09/06/2015    [provider]  ALPRAZolam Prudy Feeler) 0.5 MG tablet Take 0.5 mg by mouth daily as needed. 01/14/22   [provider]  cetirizine (ZYRTEC) 10 MG tablet Take 10 mg by mouth daily.    [provider]  cilostazol (PLETAL) 100 MG tablet Take 1 tablet (100 mg total) by mouth 2 (two) times daily before a meal. 02/11/22   Nada Libman, MD  diclofenac sodium (VOLTAREN) 1 % GEL Apply 2 g topically 4 (four) times daily. 09/12/18   Law, Waylan Boga, PA-C  gabapentin (NEURONTIN) 300 MG capsule Take 300 mg by mouth daily. 01/14/22   [provider]  glimepiride (AMARYL) 4 MG tablet Take 4 mg by mouth 2 (two) times daily.    [provider]  losartan (COZAAR) 25 MG tablet Take 25 mg by mouth daily. 11/29/21   [provider]  pioglitazone (ACTOS) 45 MG tablet Take 45 mg by mouth daily. 01/14/22   [provider]  tamsulosin (FLOMAX) 0.4 MG CAPS capsule Take 1 capsule (0.4 mg total) by mouth daily. 10/20/14   Orson Ape, MD      Allergies    Actos [pioglitazone hydrochloride], Cephalexin, Ciprofloxacin, Codeine, Cyclobenzaprine hcl, Diclofenac sodium, Hydrocodone, Meperidine hcl, Meperidine hcl, Metformin, Penicillins, and Pentazocine lactate    Review of Systems   Review of Systems  Genitourinary:  Positive for flank pain.    Physical Exam Updated Vital Signs BP (!) 150/76   Pulse 97   Temp 97.9 F (36.6 C)   Resp 20   SpO2 100%  Physical Exam Vitals reviewed.  Constitutional:      General: He is not in acute distress. HENT:     Head: Normocephalic and atraumatic.  Eyes:     Extraocular Movements: Extraocular movements intact.     Conjunctiva/sclera: Conjunctivae normal.     Pupils: Pupils are equal, round, and reactive to light.  Cardiovascular:     Rate and Rhythm: Normal rate and regular rhythm.     Pulses: Normal pulses.     Heart sounds: Normal heart sounds.     Comments: 2+ bilateral radial/dorsalis pedis pulses with regular rate Pulmonary:     Effort: Pulmonary  effort is normal. No respiratory distress.     Breath sounds: Normal breath sounds.  Abdominal:     Palpations: Abdomen is soft.     Tenderness: There is no abdominal tenderness. There is no right CVA tenderness, left CVA tenderness, guarding or rebound.  Musculoskeletal:        General: Normal range of motion.     Cervical back: Normal range of motion and neck supple.     Comments: 5 out of 5 bilateral grip/leg extension strength  Skin:    General: Skin is warm and dry.     Capillary Refill: Capillary refill takes less than 2 seconds.     Comments: No rash noted however patient was indicating along a dermatome  right under the left pectoral area wrapping around the left flank a burning sensation  Neurological:     General: No focal deficit present.     Mental Status: He is alert and oriented to person, place, and time.     Comments: Sensation intact in all 4 limbs  Psychiatric:        Mood and Affect: Mood normal.     ED Results / Procedures / Treatments   Labs (all labs ordered are listed, but only abnormal results are displayed) Labs Reviewed  COMPREHENSIVE METABOLIC PANEL - Abnormal; Notable for the following components:      Result Value   Glucose, Bld 155 (*)    AST 12 (*)    All other components within normal limits  LIPASE, BLOOD  CBC  URINALYSIS, ROUTINE W REFLEX MICROSCOPIC    EKG None  Radiology CT Renal Stone Study Result Date: 04/23/2023 CLINICAL DATA:  Abdominal pain EXAM: CT ABDOMEN AND PELVIS WITHOUT CONTRAST TECHNIQUE: Multidetector CT imaging of the abdomen and pelvis was performed following the standard protocol without IV contrast. RADIATION DOSE REDUCTION: This exam was performed according to the departmental dose-optimization program which includes automated exposure control, adjustment of the mA and/or kV according to patient size and/or use of iterative reconstruction technique. COMPARISON:  01/31/2021 FINDINGS: Lower chest: No acute abnormality. Hepatobiliary: No focal liver abnormality is seen. No gallstones, gallbladder wall thickening, or biliary dilatation. Pancreas: Unremarkable Spleen: Unremarkable Adrenals/Urinary Tract: Stable 21 mm benign adrenal adenoma within the right adrenal gland for which no follow-up imaging is recommended. Left adrenal gland is unremarkable. The kidneys are normal in size and position. Mild bilateral nonspecific perinephric stranding appears stable. No hydronephrosis. 2 mm punctate nonobstructing calculus noted within the lower pole the left kidney. No additional renal or ureteral calculi. Bladder unremarkable. Stomach/Bowel: Mild  sigmoid diverticulosis. Stomach, small bowel, and large bowel are otherwise unremarkable. Appendix normal. No free intraperitoneal gas or fluid. Vascular/Lymphatic: Aortic atherosclerosis. No enlarged abdominal or pelvic lymph nodes. Reproductive: Prostate is unremarkable. Other: No abdominal wall hernia or abnormality. No abdominopelvic ascites. Musculoskeletal: No acute or significant osseous findings. IMPRESSION: 1. No acute intra-abdominal pathology identified. No definite radiographic explanation for the patient's reported symptoms. 2. Minimal left nonobstructing nephrolithiasis. No hydronephrosis. No urolithiasis. 3. Mild sigmoid diverticulosis without superimposed acute inflammatory change. 4. Stable 21 mm benign right adrenal adenoma for which no follow-up imaging is recommended. Aortic Atherosclerosis (ICD10-I70.0). Electronically Signed   By: Helyn Numbers M.D.   On: 04/23/2023 23:44    Procedures Procedures    Medications Ordered in ED Medications - No data to display  ED Course/ Medical Decision Making/ A&P  Medical Decision Making Amount and/or Complexity of Data Reviewed Labs: ordered. Radiology: ordered.   Mathew Lamb 72 y.o. presented today for flank pain. Working DDx that I considered at this time includes, but not limited to, MSK, nephrolithiasis, pyelonephritis, AAA, aortic dissection, mesenteric ischemia, RCC, obstructive uropathy, renal infarct/hemorrhage, tumor, biliary colic, pancreatitis, appendicitis, SBO, diverticulitis, shingles, lower lobe pneumonia, testicular torsion, epididymitis.  R/o DDx: MSK, nephrolithiasis, pyelonephritis, AAA, aortic dissection, mesenteric ischemia, RCC, obstructive uropathy, renal infarct/hemorrhage, tumor, biliary colic, pancreatitis, appendicitis, SBO, diverticulitis, lower lobe pneumonia, testicular torsion, epididymitis: These are considered less likely due to history of present illness, physical  exam, lab/imaging findings  Review of prior external notes: 01/23/2023 office visit  Unique Tests and My Interpretation:  CBC: Unremarkable CMP: Unremarkable Lipase: Unremarkable UA: Unremarkable CT Renal stone study: Nonobstructing stone noted, stable right adrenal adenoma  Social Determinants of Health: none  Discussion with Independent Historian: None  Discussion of Management of Tests: None  Risk: Medium: prescription drug management  Risk Stratification Score: None  Plan: On exam patient was no acute distress stable vitals.  Patient's exam does show that he is having a burning sensation along a dermatome on the left side rating from the left flank down to the left pectoral region.  No rashes present however symptoms just started today and so this could be early onset zoster.  Patient did not have any CVA tenderness and currently waiting on the CT scan.  However his labs do look reassuring.  If CT is negative will discharge on Valtrex and follow-up with his primary care provider.  Patient not requiring any pain meds at this time.  CT negative.  Due to patient's labs and imaging being reassuring physical exam being consistent with the description of early shingles will give patient Valtrex and follow-up with her primary care provider.  I did offer pain meds however patient states he cannot take strong pain meds due to allergies and so encouraged him to use Tylenol every 6 hours as needed for pain.  Patient was given return precautions. Patient stable for discharge at this time.  Patient verbalized understanding of plan.  This chart was dictated using voice recognition software.  Despite best efforts to proofread,  errors can occur which can change the documentation meaning.         Final Clinical Impression(s) / ED Diagnoses Final diagnoses:  Herpes zoster without complication    Rx / DC Orders ED Discharge Orders          Ordered    valACYclovir (VALTREX) 1000 MG  tablet  3 times daily        04/23/23 2353              Remi Deter 04/23/23 2355    Vanetta Mulders, MD 04/25/23 2137

## 2023-04-23 NOTE — ED Triage Notes (Signed)
Left sided flank pain. Hx kidney stones.  LUQ pain x a few weeks

## 2023-04-23 NOTE — ED Notes (Signed)
ED Provider at bedside. 

## 2023-04-23 NOTE — Discharge Instructions (Addendum)
Please follow-up with your primary care provider regards recent ER visit.  Today your labs and imaging were reassuring and with your physical exam this would be consistent with shingles.  I have sent your medication to your pharmacy so please pick this up and take as prescribed.  If symptoms change or worsen please return to the ER.

## 2023-04-23 NOTE — ED Notes (Signed)
Patient transported to CT 

## 2023-06-19 ENCOUNTER — Other Ambulatory Visit: Payer: Self-pay | Admitting: Family Medicine

## 2023-06-19 DIAGNOSIS — M5416 Radiculopathy, lumbar region: Secondary | ICD-10-CM

## 2023-07-05 ENCOUNTER — Ambulatory Visit
Admission: RE | Admit: 2023-07-05 | Discharge: 2023-07-05 | Disposition: A | Discharge status: Home / Self Care | Payer: Medicare HMO | Source: Ambulatory Visit | Attending: Family Medicine | Admitting: Family Medicine

## 2023-07-05 DIAGNOSIS — M5416 Radiculopathy, lumbar region: Secondary | ICD-10-CM

## 2023-08-07 ENCOUNTER — Ambulatory Visit: Payer: Medicare HMO | Attending: Cardiology | Admitting: Cardiology

## 2023-08-07 ENCOUNTER — Encounter: Payer: Self-pay | Admitting: Cardiology

## 2023-08-07 VITALS — BP 135/76 | HR 85 | Ht 66.0 in | Wt 223.6 lb

## 2023-08-07 DIAGNOSIS — F17210 Nicotine dependence, cigarettes, uncomplicated: Secondary | ICD-10-CM

## 2023-08-07 DIAGNOSIS — E1169 Type 2 diabetes mellitus with other specified complication: Secondary | ICD-10-CM | POA: Diagnosis not present

## 2023-08-07 DIAGNOSIS — I739 Peripheral vascular disease, unspecified: Secondary | ICD-10-CM | POA: Diagnosis not present

## 2023-08-07 DIAGNOSIS — I10 Essential (primary) hypertension: Secondary | ICD-10-CM

## 2023-08-07 DIAGNOSIS — E785 Hyperlipidemia, unspecified: Secondary | ICD-10-CM

## 2023-08-07 DIAGNOSIS — R0789 Other chest pain: Secondary | ICD-10-CM

## 2023-08-07 DIAGNOSIS — Z716 Tobacco abuse counseling: Secondary | ICD-10-CM

## 2023-08-07 MED ORDER — NEXLIZET 180-10 MG PO TABS: 1.0000 | ORAL_TABLET | Freq: Every day | ORAL | 11 refills | Status: DC

## 2023-08-07 MED ORDER — ASPIRIN 81 MG PO TBEC
81.0000 mg | DELAYED_RELEASE_TABLET | Freq: Every day | ORAL | Status: AC
Start: 2023-08-07 — End: ?

## 2023-08-07 NOTE — Assessment & Plan Note (Signed)
 Hyperlipidemia Suboptimal cholesterol levels with diabetes, statin intolerance. Discussed Nexletol to avoid muscle side effects. - Initiate Nexlizet (ezetimibe 10 mg-bempedoic acid 180 mg) 10-180 mg for cholesterol management. - Recheck lipid panel in 2-3 months => would probably want to try low-dose statin if not able to control.  Diabetes Mellitus Borderline control, A1c 6.9%, glucose elevated post-steroids. On Amaryl and Actos, Ozempic discontinued due to cost.  Discussed Medicare deductible and options for reinitiating Ozempic or alternatives. - Continue Amaryl and Actos per PCP.   - Discuss options for reinitiating Ozempic or alternatives considering cost.

## 2023-08-07 NOTE — Assessment & Plan Note (Signed)
 Well-controlled with losartan, normalized post-steroid injection elevation. - Continue losartan 25 mg daily - Closely monitor pressures and follow-up as initial pressure today was mildly elevated.Marland Kitchen

## 2023-08-07 NOTE — Assessment & Plan Note (Addendum)
 Smoking one pack per day, significant risk for PAD and cardiovascular disease. Previous cessation attempts unsuccessful. - Smoking cessation instruction/counseling given:  counseled patient on the dangers of tobacco use, advised patient to stop smoking, and reviewed strategies to maximize success  4 minutes spent in discussion.

## 2023-08-07 NOTE — Assessment & Plan Note (Signed)
 Claudication with calf pain, moderate disease on ABI, significant smoking risk. Discussed potential coronary or carotid artery disease. Emphasized managing hypertension, hyperlipidemia, diabetes, and smoking cessation. - Order ABI and Doppler studies for the lower extremities. - Refer to Dr. Kary Kos if Doppler indicates significant blockages. - Start aspirin therapy. - Initiate Nexlizet (bempedoic acid) for cholesterol management.

## 2023-08-07 NOTE — Patient Instructions (Addendum)
 Medication Instructions:  START Nexlizet 10-180 mg once daily  START Aspirin 81 mg once daily  *If you need a refill on your cardiac medications before your next appointment, please call your pharmacy*  Lab Work: Your provider would like for you to return in 2-3 months to have the following labs drawn: Fasting Lipid.   Please go to [facility] [address] ([facility]) [address], [place] [address] You do not need an appointment.  They are open from 8 am- 4:30 pm.  Lunch from 1:00 pm- 2:00 pm You will need to be fasting.   You may also go to one of the following [facility]:  [address] [place], [place] [address] Phone: [phone] Lab hours: Mon-Fri 8 am- 5 pm    Lunch 12 pm- 1 pm  [address] [place],  [place]  [address]  [place] Phone: [phone] Lab hours: 7 am- 4 pm Lunch 12 pm-1 pm   [address] [place],  [place]  [address]  [place] Phone: [phone] Lab hours: Mon-Fri 8 am- 5 pm    Lunch 12 pm- 1 pm  If you have labs (blood work) drawn today and your tests are completely normal, you will receive your results only by: MyChart Message (if you have MyChart) OR A paper copy in the mail If you have any lab test that is abnormal or we need to change your treatment, we will call you to review the results.  Testing/Procedures: Your physician has requested that you have an ankle brachial index (ABI). During this test an ultrasound and blood pressure cuff are used to evaluate the arteries that supply the arms and legs with blood.  Allow thirty minutes for this exam.  There are no restrictions or special instructions.  This will take place at [address] ([facility]) [address], [place] [address]  Your physician has requested that you have a lower extremity arterial duplex. During this test, ultrasound is used to evaluate arterial blood flow in the legs. Allow one hour for this exam. There are no restrictions or special instructions. This will  take place at [address] [facility]) [address], [place] [address]  Please note: We ask at that you not bring children with you during ultrasound (echo/ vascular) testing. Due to room size and safety concerns, children are not allowed in the ultrasound rooms during exams. Our front office staff cannot provide observation of children in our lobby area while testing is being conducted. An adult accompanying a patient to their appointment will only be allowed in the ultrasound room at the discretion of the ultrasound technician under special circumstances. We apologize for any inconvenience.   Follow-Up: At [facility], you and your health needs are our priority.  As part of our continuing mission to provide you with exceptional heart care, our providers are all part of one team.  This team includes your primary Cardiologist (physician) and Advanced Practice Providers or APPs (Physician Assistants and Nurse Practitioners) who all work together to provide you with the care you need, when you need it.  Your next appointment:   3 month(s)  Provider:   Bryan Lemma, MD    We recommend signing up for the patient portal called "MyChart".  Sign up information is provided on this After Visit Summary.  MyChart is used to connect with patients for Virtual Visits (Telemedicine).  Patients are able to view lab/test results, encounter notes, upcoming appointments, etc.  Non-urgent messages can be sent  to your provider as well.   To learn more about what you can do with MyChart, go to [email or web address].

## 2023-08-07 NOTE — Progress Notes (Addendum)
 * Cardiology Office Note:  .   Date:  08/09/2023  ID:  Mathew Lamb, DOB Apr 13, 1951, MRN [id number] PCP: Trisha Mangle, FNP  [facility] Providers Cardiologist:  None     Chief Complaint  Patient presents with   Pain    Patient had pain on his left side and in his backe. Patient was told that it could of been shingles. Patient also states that he has pain issues with his leg. When he walks he experiences pain and the goes away when he rest. Meds reviewed.    New Patient (Initial Visit)    PCP said I should come    Patient Profile: .     Mathew Lamb is a moderately obese 73 y.o. male smoker with a PMH notable for DM-2 & HTN who presents here for Baseline Cardiovascular Evaluation at the request of Trisha Mangle, *.   Mathew Lamb presents without any referral note.  Subjective  Discussed the use of AI scribe software for clinical note transcription with the patient, who gave verbal consent to proceed.  History of Present Illness History of Present Illness Mathew Lamb is a 73 year old male who presents with leg pain and claudication for a cardiology evaluation. He was referred by his primary care doctor for a cardiology evaluation due to his age and recent symptoms.  He experiences severe leg pain and claudication, particularly in the calves, which worsen with walking and improve with rest. The pain is so severe that he has to sit down, with variability in intensity, having some days worse than others. He has been evaluated by a vein specialist and was previously prescribed cilostazol, which he discontinued due to lack of efficacy. No chest pain, tightness, or pressure during physical activity. No heart palpitations, shortness of breath, or dizziness. He reports occasional leg swelling, for which he takes furosemide as needed, approximately once a week.  He has a history of back pain that began about a month ago, described as pain radiating from his  hip to his feet. An MRI was performed, and he received steroid injections, which caused significant increases in his blood sugar and blood pressure. He is frustrated that the MRI results were not reviewed before the injections were administered.  His past medical history includes diabetes, managed with Amaryl 4 mg and Actos 45 mg, and hypertension, managed with losartan 25 mg. He was previously on Ozempic but discontinued it due to cost. He has a significant smoking history, having smoked since childhood and currently smoking about a pack a day, reduced from over two packs a day. Cardiovascular ROS: positive for - dyspnea on exertion and bilateral calf claudication that limits his walking more so than dyspnea.  Sensation of pedal neuropathy negative for - chest pain, edema, irregular heartbeat, orthopnea, palpitations, paroxysmal nocturnal dyspnea, rapid heart rate, shortness of breath, or syncope/near syncope or TIA/amaurosis fugax, claudication  ROS:  Review of Systems - Negative except Sx noted aboe    Objective   Medications - Furosemide, as needed (dose not listed - Amaryl 4 mg daily - Actos 45 mg daily - Losartan 25 mg daily - Zyrtec 10 mg daily - Neurontin 300 mg daily - Flomax 0.4 mg daily - As needed: Xanax 0.5 mg; celecoxib 100 mg twice daily; Voltaren gel  Studies Reviewed: Marland Kitchen   EKG Interpretation Date/Time:  Thursday August 07 2023 10:10:01 EDT Ventricular Rate:  85 PR Interval:  170 QRS Duration:  92 QT Interval:  356 QTC Calculation: 423 R Axis:   -1  Text Interpretation: Normal sinus rhythm Normal ECG When compared with ECG of 12-Sep-2018 12:51, Confirmed by Bryan Lemma ([id number]) on 08/07/2023 10:26:13 AM    LABS Lipid Panel: Total cholesterol 169 mg/dL, Triglycerides 914 mg/dL, HDL 50 mg/dL, LDL 96 mg/dL (78/29/5621) HYQ6V: 7.8% (05/19/2023)  DIAGNOSTIC Myoview treadmill Stress Test: Normal pump function, no ischemia or infarction (2013)  Risk  Assessment/Calculations:           Physical Exam:   VS:  BP 135/76   Pulse 85   Ht 5\' 6"  (1.676 m)   Wt 223 lb 9.6 oz (101.4 kg)   SpO2 98%   BMI 36.09 kg/m    Wt Readings from Last 3 Encounters:  08/07/23 223 lb 9.6 oz (101.4 kg)  05/20/22 217 lb (98.4 kg)  02/11/22 215 lb 11.2 oz (97.8 kg)    GEN: Well nourished, well developed in no acute distress; moderately obese NECK: No JVD; No carotid bruits CARDIAC: Distant heart sounds - otw normal S1, S2; RRR, no murmurs, rubs, gallops RESPIRATORY:  Clear to auscultation without rales, wheezing or rhonchi ; nonlabored, good air movement. ABDOMEN: Soft, non-tender, non-distended EXTREMITIES:  No edema; No deformity     ASSESSMENT AND PLAN: .    Problem List Items Addressed This Visit       Cardiology Problems   Hyperlipidemia associated with type 2 diabetes mellitus (HCC)   Hyperlipidemia Suboptimal cholesterol levels with diabetes, statin intolerance. Discussed Nexletol to avoid muscle side effects. - Initiate Nexlizet (ezetimibe 10 mg-bempedoic acid 180 mg) 10-180 mg for cholesterol management. - Recheck lipid panel in 2-3 months => would probably want to try low-dose statin if not able to control.  Diabetes Mellitus Borderline control, A1c 6.9%, glucose elevated post-steroids. On Amaryl and Actos, Ozempic discontinued due to cost.  Discussed Medicare deductible and options for reinitiating Ozempic or alternatives. - Continue Amaryl and Actos per PCP.   - Discuss options for reinitiating Ozempic or alternatives considering cost.      Relevant Medications   aspirin EC 81 MG tablet   Bempedoic Acid-Ezetimibe (NEXLIZET) 180-10 MG TABS   Other Relevant Orders   Lipid panel   Primary hypertension (Chronic)   Well-controlled with losartan, normalized post-steroid injection elevation. - Continue losartan 25 mg daily - Closely monitor pressures and follow-up as initial pressure today was mildly elevated..       Relevant  Medications   aspirin EC 81 MG tablet   Bempedoic Acid-Ezetimibe (NEXLIZET) 180-10 MG TABS     Other   Chest wall discomfort (Chronic)   She described having some left lateral flank pain that was attributed to shingles.  Not having anginal chest pain.      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 Discussed potential coronary or carotid artery disease. Emphasized managing hypertension, hyperlipidemia, diabetes, and smoking cessation. - Order ABI and Doppler studies for the lower extremities. - Refer to Dr. Kary Kos if Doppler indicates significant blockages. - Start aspirin therapy. - Initiate Nexlizet (bempedoic acid) for cholesterol management.      Relevant Orders   VAS [place] ABI WITH/WO TBI   VAS [place] LOWER EXTREMITY ARTERIAL DUPLEX   Encounter for smoking cessation counseling   Smoking one pack per day, significant risk for PAD and cardiovascular disease. Previous cessation attempts unsuccessful. - Smoking cessation instruction/counseling  given:  counseled patient on the dangers of tobacco use, advised patient to stop smoking, and reviewed strategies to maximize success  4 minutes spent in discussion.           Follow-up Follow-up needed for treatment efficacy and vascular study results.  - Awaiting back evaluation before further cardiac risk stratification evaluation-likely consider Coronary Calcium Score. - Follow up: Return in about 2 months (around 10/07/2023) for Routine follow up with me, [place] office.2-3 months after lipid panel and vascular studies.     Signed, Marykay Lex, MD, MS Bryan Lemma, M.D., M.S. Interventional Cardiologist  [facility]  Pager # [phone] Phone # [phone] [address] [place], [place] [address]

## 2023-08-07 NOTE — Assessment & Plan Note (Signed)
 She described having some left lateral flank pain that was attributed to shingles.  Not having anginal chest pain.

## 2023-08-12 ENCOUNTER — Telehealth: Payer: Self-pay

## 2023-08-12 ENCOUNTER — Other Ambulatory Visit (HOSPITAL_COMMUNITY): Payer: Self-pay

## 2023-08-12 ENCOUNTER — Telehealth: Payer: Self-pay | Admitting: Cardiology

## 2023-08-12 NOTE — Telephone Encounter (Signed)
 Pharmacy Patient Advocate Encounter  Received notification from [facility] that Prior Authorization for NEXLIZET has been APPROVED from 05/07/23 to 05/05/24. Ran test claim, Copay is $331.48 (DEDUCTIBLE APPLIED TO CLAIM, PT WILL MEET THEIR DEDUCTIBLE WITH THIS FILL). This test claim was processed through [facility]- copay amounts may vary at other pharmacies due to pharmacy/plan contracts, or as the patient moves through the different stages of their insurance plan.

## 2023-08-12 NOTE — Telephone Encounter (Signed)
 SEE SEPARATE ENCOUNTER

## 2023-08-12 NOTE — Telephone Encounter (Signed)
 Patient states his insurance will not cover new medication Nexlizet 10-180mg , please assist

## 2023-08-12 NOTE — Telephone Encounter (Signed)
 Pharmacy Patient Advocate Encounter   Received notification from Physician's Office that prior authorization for NEXLIZET is required/requested.   Insurance verification completed.   The patient is insured through [employer] .   Per test claim: PA required; PA submitted to above mentioned insurance via CoverMyMeds Key/confirmation #/EOC [id number] Status is pending

## 2023-08-12 NOTE — Telephone Encounter (Signed)
 Hi Mathew Lamb! Based on test claim, Nexlizet is non form on patient's plan. Plan prefers statins or Repatha therapy. They still may approve the non formulary on a PA request. Let me attempt the PA and I will route you back in.

## 2023-08-12 NOTE — Telephone Encounter (Addendum)
 Pharmacy Patient Advocate Encounter   Received notification from Physician's Office that prior authorization for NEXLIZET is required/requested.   Insurance verification completed.   The patient is insured through [employer] .   Per test claim:  NEXLIZET IS NON FORM ON PLAN. STATINS OR REPATHA is preferred by the insurance. SOMETIMES NON FORM DRUGS CAN BE APPROVED WITH A PA, ATTEMPTING NEXLIZET PA NOW.

## 2023-08-15 NOTE — Telephone Encounter (Signed)
 Not that I am aware of, unless they requested a callback. Usually insurance or the filling pharmacy notifies them of the PA approval.

## 2023-09-10 ENCOUNTER — Ambulatory Visit: Attending: Cardiology

## 2023-09-10 ENCOUNTER — Ambulatory Visit

## 2023-09-10 ENCOUNTER — Telehealth: Payer: Self-pay

## 2023-09-10 ENCOUNTER — Other Ambulatory Visit (HOSPITAL_COMMUNITY): Payer: Self-pay

## 2023-09-10 DIAGNOSIS — I739 Peripheral vascular disease, unspecified: Secondary | ICD-10-CM

## 2023-09-10 NOTE — Telephone Encounter (Signed)
 The patient came into the office today for dopplers. He stated that he has not received a message back concerning his Nexlizet . He went to the pharmacy and the medication would be over $400. He wants to know if there is assistance or he will need a replacement.

## 2023-09-10 NOTE — Telephone Encounter (Signed)
 Enrolling patient in grant, see separate encounter

## 2023-09-10 NOTE — Telephone Encounter (Signed)
 Patient Advocate Encounter   The patient was approved for a [employer] grant that will help cover the cost of NEXLIZET  Total amount awarded, $2,500.  Effective: 08/11/23 - 08/09/24   [id number] [id number]   Pharmacy provided with approval and processing information. Patient informed via Tilman Fonder, CPhT  Pharmacy Patient Advocate Specialist  Direct Number: [phone] Fax: [phone]

## 2023-09-12 LAB — VAS US ABI WITH/WO TBI
Left ABI: 0.66
Right ABI: 0.63

## 2023-09-15 ENCOUNTER — Telehealth: Payer: Self-pay | Admitting: *Deleted

## 2023-09-15 NOTE — Telephone Encounter (Signed)
 Spoke with patient and reviewed provider recommendations to set up appointment with Dr. Alvenia Aus. Scheduled him appointment to come in and see Dr. Alvenia Aus on 6/10.   Patient stated that he started the Nexlizet  and had terrible headaches and elevated blood pressures up to 180/90. He stopped taking it and is no longer having those symptoms. Advised that I would let  provider know and we would call back if he has any further recommendations.

## 2023-09-15 NOTE — Telephone Encounter (Signed)
-----   Message from Randene Bustard sent at 09/15/2023 11:23 AM EDT ----- Deboraha Fallow see if se can get him in to see Dr. Alvenia Aus. DH ----- Message ----- From: Wenona Hamilton, MD Sent: 09/12/2023   4:13 PM EDT To: Arleen Lacer, MD  He has monophasic waveforms in the common femoral arteries which is highly suggestive of significant iliac disease.  If he is symptomatic, he might benefit from angiography or at least CTA.  I am happy to see him.

## 2023-09-15 NOTE — Telephone Encounter (Signed)
-----   Message from Randene Bustard sent at 09/11/2023  7:10 PM EDT ----- Ankle-brachial indices on the right side have decreased since last year from 0.73-0.63, on the left side is stable.  However, the actual Dopplers showed no discrete stenoses on either side.  I have attached the results to Dr. Alvenia Aus -to see if he is any suggestions of any other testing.  Randene Bustard, MD

## 2023-09-16 NOTE — Telephone Encounter (Signed)
 Try starting with Zetia 10 mg daily. Just a 1 month supply with 3 refills.   Randene Bustard, MD

## 2023-09-18 MED ORDER — EZETIMIBE 10 MG PO TABS: 10.0000 mg | ORAL_TABLET | Freq: Every day | ORAL | 3 refills | Status: DC

## 2023-09-18 NOTE — Telephone Encounter (Signed)
 Patient returned call and we discussed provider recommendations. He was agreeable with plan and had no further questions at this time.

## 2023-09-18 NOTE — Addendum Note (Signed)
 Addended by: Dudley Ghee on: 09/18/2023 10:37 AM   Modules accepted: Orders

## 2023-09-18 NOTE — Telephone Encounter (Signed)
 Left voicemail message to call back

## 2023-09-18 NOTE — Telephone Encounter (Signed)
 Patient returned RN's call.

## 2023-10-14 ENCOUNTER — Ambulatory Visit: Attending: Cardiovascular Disease | Admitting: Cardiovascular Disease

## 2023-10-14 ENCOUNTER — Encounter: Payer: Self-pay | Admitting: Cardiovascular Disease

## 2023-10-14 VITALS — BP 120/68 | HR 83 | Ht 66.0 in | Wt 226.1 lb

## 2023-10-14 DIAGNOSIS — I10 Essential (primary) hypertension: Secondary | ICD-10-CM

## 2023-10-14 DIAGNOSIS — E785 Hyperlipidemia, unspecified: Secondary | ICD-10-CM | POA: Diagnosis not present

## 2023-10-14 DIAGNOSIS — I739 Peripheral vascular disease, unspecified: Secondary | ICD-10-CM

## 2023-10-14 DIAGNOSIS — Z720 Tobacco use: Secondary | ICD-10-CM

## 2023-10-14 MED ORDER — CILOSTAZOL 50 MG PO TABS: 50.0000 mg | ORAL_TABLET | Freq: Two times a day (BID) | ORAL | 3 refills | Status: DC

## 2023-10-14 NOTE — Patient Instructions (Signed)
 Medication Instructions:  Your physician recommends the following medication changes.  START TAKING: Pletal  50 mg by mouth daily twice a day  *If you need a refill on your cardiac medications before your next appointment, please call your pharmacy*  Lab Work: No labs ordered today   Testing/Procedures: No test ordered today   Follow-Up: At [facility], you and your health needs are our priority.  As part of our continuing mission to provide you with exceptional heart care, our providers are all part of one team.  This team includes your primary Cardiologist (physician) and Advanced Practice Providers or APPs (Physician Assistants and Nurse Practitioners) who all work together to provide you with the care you need, when you need it.  Your next appointment:   4 month(s)  Provider:   Antionette Kirks, MD

## 2023-10-14 NOTE — Progress Notes (Signed)
 Cardiology Office Note   Date:  10/14/2023   ID:  Mathew Lamb, Mathew Lamb Jan 04, 1951, MRN [id number]  PCP:  Mathew Muslim, FNP  Cardiologist:  Dr. Addie Holstein  Chief Complaint  Patient presents with   Follow-up    F/u ABI D/c nexlizet  due to migraine and never started Pletal . Meds reviewed verbally with pt.      History of Present Illness: Mathew Lamb is a 73 y.o. male who was referred by Dr. Addie Holstein for evaluation and management of peripheral arterial disease. He has known history of obesity, tobacco use, type 2 diabetes, essential hypertension and hyperlipidemia.  He is a retired [employer] and reports having a back injury about 30 years ago with chronic low back pain since then.  Over the last 1 to 2 years, he experienced severe bilateral calf discomfort with minimal walking which has been progressive.  He underwent noninvasive vascular studies last month which showed an ABI of 0.63 on the right and 0.66 on the left.  Duplex showed no significant infrainguinal disease but he was noted to have monophasic waveforms in the common femoral arteries.  He has chronic back pain.  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  He had a steroid injection with partial relief of symptoms.  He denies any chest pain.  He reports chronic exertional dyspnea.  He smokes 1 pack/day and has been smoking since he was 73 years old.  Past Medical History:  Diagnosis Date   Allergy    Anxiety    Arthritis    Chronic low back pain    GERD (gastroesophageal reflux disease)    Heart murmur    History of kidney stones ~2005   found on CT   History of Lyme disease 1990s   History of prostatitis    Hypertension    Smoker    T2DM (type 2 diabetes mellitus) (HCC) 2010   Vitamin D deficiency     Past Surgical History:  Procedure Laterality Date   EXTRACORPOREAL SHOCK WAVE LITHOTRIPSY Left 10/20/2014   Procedure: EXTRACORPOREAL SHOCK WAVE  LITHOTRIPSY (ESWL);  Surgeon: Rea Cambridge, MD;  Location: [facility] ORS;  Service: Urology;  Laterality: Left;   FINGER SURGERY     right second finger     Current Outpatient Medications  Medication Sig Dispense Refill   acetaminophen  (TYLENOL ) 500 MG tablet Take 1,000 mg by mouth every 6 (six) hours as needed for pain. Reported on 09/06/2015     ALPRAZolam (XANAX) 0.5 MG tablet Take 0.5 mg by mouth daily as needed.     aspirin  EC 81 MG tablet Take 1 tablet (81 mg total) by mouth daily. Swallow whole.     celecoxib (CELEBREX) 100 MG capsule Take 100 mg by mouth 2 (two) times daily.     cetirizine (ZYRTEC) 10 MG tablet Take 10 mg by mouth daily.     diclofenac  sodium (VOLTAREN ) 1 % GEL Apply 2 g topically 4 (four) times daily. 1 Tube 5   ezetimibe  (ZETIA ) 10 MG tablet Take 1 tablet (10 mg total) by mouth daily. 30 tablet 3   gabapentin (NEURONTIN) 300 MG capsule Take 300 mg by mouth daily.     glimepiride (AMARYL) 4 MG tablet Take 4 mg by mouth 2 (two) times daily.     losartan (COZAAR) 25 MG tablet Take 25 mg by mouth daily.     pioglitazone (ACTOS) 45 MG tablet Take 45 mg by mouth  daily.     tamsulosin  (FLOMAX ) 0.4 MG CAPS capsule Take 1 capsule (0.4 mg total) by mouth daily. 30 capsule 11   cilostazol  (PLETAL ) 50 MG tablet Take 1 tablet (50 mg total) by mouth 2 (two) times daily before a meal. 180 tablet 3   No current facility-administered medications for this visit.    Allergies:   Cephalexin, Actos [pioglitazone hydrochloride], Ciprofloxacin , Codeine, Cyclobenzaprine hcl, Diclofenac  sodium, Hydrocodone , Meperidine hcl, Meperidine hcl, Metformin, Penicillins, and Pentazocine lactate    Social History:  The patient  reports that he has been smoking cigarettes. He has a 55 pack-year smoking history. He has never used smokeless tobacco. He reports that he does not drink alcohol and does not use drugs.   Family History:  The patient's family history includes Alcohol abuse in his father and  mother; Cirrhosis in his mother; Diabetes in his mother; Pulmonary embolism in his father.    ROS:  Please see the history of present illness.   Otherwise, review of systems are positive for none.   All other systems are reviewed and negative.    PHYSICAL EXAM: VS:  BP 120/68 (BP Location: Left Arm, Patient Position: Sitting, Cuff Size: Large)   Pulse 83   Ht 5\' 6"  (1.676 m)   Wt 226 lb 2 oz (102.6 kg)   SpO2 97%   BMI 36.50 kg/m  , BMI Body mass index is 36.5 kg/m. GEN: Well nourished, well developed, in no acute distress  HEENT: normal  Neck: no JVD, carotid bruits, or masses Cardiac: RRR; no murmurs, rubs, or gallops,no edema  Respiratory:  clear to auscultation bilaterally, normal work of breathing GI: soft, nontender, nondistended, + BS MS: no deformity or atrophy  Skin: warm and dry, no rash Neuro:  Strength and sensation are intact Psych: euthymic mood, full affect Vascular: Femoral pulse: +1 bilaterally.  Distal pulses are not palpable.   EKG:  EKG is not ordered today.    Recent Labs: 04/23/2023: ALT 15; BUN 17; Creatinine, Ser 0.95; Hemoglobin 14.2; Platelets 228; Potassium 3.9; Sodium 139    Lipid Panel    Component Value Date/Time   CHOL 179 04/09/2011 1224   TRIG 164.0 (H) 04/09/2011 1224   HDL 36.80 (L) 04/09/2011 1224   CHOLHDL 5 04/09/2011 1224   VLDL 32.8 04/09/2011 1224   LDLCALC 109 (H) 04/09/2011 1224   LDLDIRECT 132.2 02/19/2012 1355      Wt Readings from Last 3 Encounters:  10/14/23 226 lb 2 oz (102.6 kg)  08/07/23 223 lb 9.6 oz (101.4 kg)  05/20/22 217 lb (98.4 kg)           08/07/2023   10:00 AM  PAD Screen  Previous PAD dx? Yes  Previous surgical procedure? No  Pain with walking? Yes  Subsides with rest? Yes  Feet/toe relief with dangling? No  Painful, non-healing ulcers? No  Extremities discolored? No      ASSESSMENT AND PLAN:  1.  Peripheral arterial disease: Severe bilateral leg claudication likely due to aortoiliac  disease.  I discussed with him the natural history and management of claudication.  Considering severity of his symptoms, I believe he benefit from revascularization.  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  I discussed with him the importance of controlling his risk factors and the importance of a regular walking program. He was prescribed cilostazol  but did not start the medication.  I prescribed  this again at 50 mg twice daily.  2.  Hyperlipidemia: Statin intolerant.  He is tolerating ezetimibe  at this time which was started recently.  3.  Essential hypertension: Blood pressure is controlled.  4.  Tobacco use: I discussed with him the importance of smoking cessation.    Disposition:   FU with me in 4 months  Signed,  Antionette Kirks, MD  10/14/2023 4:08 PM    [facility]

## 2023-11-13 ENCOUNTER — Telehealth: Payer: Self-pay

## 2023-11-13 ENCOUNTER — Ambulatory Visit: Attending: Cardiology | Admitting: Cardiology

## 2023-11-13 ENCOUNTER — Telehealth: Payer: Self-pay | Admitting: Cardiology

## 2023-11-13 ENCOUNTER — Encounter: Payer: Self-pay | Admitting: Cardiology

## 2023-11-13 ENCOUNTER — Other Ambulatory Visit (HOSPITAL_COMMUNITY): Payer: Self-pay

## 2023-11-13 VITALS — BP 138/70 | HR 87 | Ht 66.0 in | Wt 225.4 lb

## 2023-11-13 DIAGNOSIS — G720 Drug-induced myopathy: Secondary | ICD-10-CM

## 2023-11-13 DIAGNOSIS — Z79899 Other long term (current) drug therapy: Secondary | ICD-10-CM | POA: Diagnosis not present

## 2023-11-13 DIAGNOSIS — E1169 Type 2 diabetes mellitus with other specified complication: Secondary | ICD-10-CM

## 2023-11-13 DIAGNOSIS — E785 Hyperlipidemia, unspecified: Secondary | ICD-10-CM

## 2023-11-13 DIAGNOSIS — F17200 Nicotine dependence, unspecified, uncomplicated: Secondary | ICD-10-CM

## 2023-11-13 DIAGNOSIS — E118 Type 2 diabetes mellitus with unspecified complications: Secondary | ICD-10-CM | POA: Diagnosis not present

## 2023-11-13 DIAGNOSIS — I10 Essential (primary) hypertension: Secondary | ICD-10-CM

## 2023-11-13 DIAGNOSIS — I739 Peripheral vascular disease, unspecified: Secondary | ICD-10-CM

## 2023-11-13 DIAGNOSIS — T466X5A Adverse effect of antihyperlipidemic and antiarteriosclerotic drugs, initial encounter: Secondary | ICD-10-CM

## 2023-11-13 MED ORDER — LOSARTAN POTASSIUM 50 MG PO TABS: 25.0000 mg | ORAL_TABLET | Freq: Every day | ORAL | 3 refills | Status: DC

## 2023-11-13 MED ORDER — LOSARTAN POTASSIUM 50 MG PO TABS: 50.0000 mg | ORAL_TABLET | Freq: Every day | ORAL | 3 refills | Status: AC

## 2023-11-13 MED ORDER — NEXLETOL 180 MG PO TABS: 180.0000 mg | ORAL_TABLET | Freq: Every day | ORAL | 3 refills | Status: DC

## 2023-11-13 NOTE — Progress Notes (Signed)
 Cardiology Office Note:  .   Date:  11/19/2023  ID:  Mathew Lamb, DOB 10/14/1950, MRN [id number] PCP: Jesus Elberta Gainer, FNP   HeartCare Providers Cardiologist:  None     Chief Complaint  Patient presents with   3 month follow up     Doing well.     Patient Profile: .     Mathew Lamb is a mildly obese 73 y.o. male smoker (1 pack a day-started smoking when he was 6) with a PMH notable for DM-2, HTN, HLD and PAD who presents here for 85-month follow-up to discuss test results.   Originally referred for Baseline Cardiovascular Evaluation at the request of Jesus Elberta Gainer, *.    Mathew Lamb was seen for initial consultation on August 07, 2023.  His major complaint was some pain on his left side and back possibly related to potentially shingles.  Also having leg pain with walking-symptoms consistent with potential claudication.  He was referred to Dr. Deatrice Cage.  Lipids were poorly controlled and therefore we initiated Nexlizet  with plans to recheck lipids in 2 to 3 months.  BP was well-controlled Plan was to see him back after PV evaluation by Dr. Cage, at which time we could discuss cardiac restratification with Coronary Calcium Score. => Nexlizet  was not covered, but this was overly covered by health well grant. =-> Unfortunately, he said that he did not tolerate Nexletol  send he had headaches so he therefore took Zetia .  Noted to be intolerant of multiple statins.  He was seen by Dr. Cage on June 10: He described bilateral calf pain with walking.  Right ABI was 0.63 and left was 0.66.  Duplex did not suggest infrainguinal disease.  He discussed undergoing peripheral angiography to better evaluate his anatomy, but he indicated that he is a sole caregiver for his older brother who is terminally sick.  He did not want to leave his brother alone to have a procedure done.  Therefore discussed initiating a walking program and recommend trying cilostazol .   Discussed smoke cessation.  Plan was for 54-month follow-up.  Subjective  Discussed the use of AI scribe software for clinical note transcription with the patient, who gave verbal consent to proceed.  History of Present Illness History of Present Illness Mathew Lamb is a 73 year old male with leg artery disease who presents for follow-up on cholesterol management and leg pain.  He has been managing leg artery disease and experiences persistent leg pain when walking. He has postponed further tests and potential intervention due to caregiving responsibilities for his older brother, who is in poor health. The patient reports no chest pain or tightness during physical activity.  He started on Zetia  for cholesterol management and tolerates it well. Previously, he tried another medication that caused severe headaches, leading to its discontinuation. He has not been on any cholesterol medication prior to this year. His last cholesterol check was in January, and he is due for another test.  He continues to smoke approximately a pack a day, acknowledging the difficulty in quitting despite previous attempts to reduce his intake. He has a long history of smoking, starting from a young age.  He is currently taking losartan  for blood pressure management, with readings typically around 140/70 mmHg. He also uses furosemide as needed for ankle swelling. No significant chest pain, pressure, or shortness of breath when lying down. He occasionally experiences drooling, attributed to a history of Bell's palsy.  He recalls having  tried statins in the past but discontinued them due to unspecified side effects. He is open to retrying medications if necessary.  He mostly noted headaches and myalgias.  He recalls having tried simvastatin, atorvastatin and at least 1 other but he is not sure which one it was.     Objective   Current Meds  Medication Sig   acetaminophen  (TYLENOL ) 500 MG tablet Take 1,000 mg by  mouth every 6 (six) hours as needed for pain. Reported on 09/06/2015   ALPRAZolam (XANAX) 0.5 MG tablet Take 0.5 mg by mouth daily as needed.   aspirin  EC 81 MG tablet Take 1 tablet (81 mg total) by mouth daily. Swallow whole.   Bempedoic Acid  (NEXLETOL ) 180 MG TABS Take 1 tablet (180 mg total) by mouth daily.   celecoxib (CELEBREX) 100 MG capsule Take 100 mg by mouth 2 (two) times daily.   cetirizine (ZYRTEC) 10 MG tablet Take 10 mg by mouth daily.   cilostazol  (PLETAL ) 50 MG tablet Take 1 tablet (50 mg total) by mouth 2 (two) times daily before a meal.   diclofenac  sodium (VOLTAREN ) 1 % GEL Apply 2 g topically 4 (four) times daily.   ezetimibe  (ZETIA ) 10 MG tablet Take 1 tablet (10 mg total) by mouth daily.   gabapentin (NEURONTIN) 300 MG capsule Take 300 mg by mouth daily.   glimepiride (AMARYL) 4 MG tablet Take 4 mg by mouth 2 (two) times daily.   pioglitazone (ACTOS) 45 MG tablet Take 45 mg by mouth daily.   tamsulosin  (FLOMAX ) 0.4 MG CAPS capsule Take 1 capsule (0.4 mg total) by mouth daily.   []  losartan  (COZAAR ) 25 MG tablet Take 25 mg by mouth daily.   Social History - Tobacco: Current smoker, 1.0 ppd x 67 years (67 pack-years). Started smoking at age 24. 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  - Living Situation: Lives with older brother who is in poor health.=> Has had multiple heart surgeries, CABG and valve.  Also defibrillator.  He does not know the details.   History of back injury right years ago with chronic low back pain.  Studies Reviewed: SABRA        No new studies  Labs from May 19, 2023: TC 169, TG 132, LDL 96, HDL 50; A1c 6.9; Cr 0.83 and normal LFTs/electrolytes.  GLU 126; Hgb 13.7 (LDL is down from 109 in 01/23/2023)  Risk Assessment/Calculations:             Physical Exam:   VS:  BP 138/70   Pulse 87   Ht 5' 6 (1.676 m)   Wt 225 lb 6 oz (102.2 kg)   SpO2 96%   BMI 36.38 kg/m    Wt Readings from Last 3 Encounters:  11/13/23 225 lb  6 oz (102.2 kg)  10/14/23 226 lb 2 oz (102.6 kg)  08/07/23 223 lb 9.6 oz (101.4 kg)    GEN: Well nourished, well developed in no acute distress; moderately obese. NECK: No JVD; No carotid bruits CARDIAC: Normal S1, S2; RRR, no murmurs, rubs, gallops RESPIRATORY:  Clear to auscultation without rales, wheezing or rhonchi ; nonlabored, good air movement. ABDOMEN: Soft, non-tender, non-distended EXTREMITIES:  No edema; No deformity; diminished pedal pulses bilaterally.     ASSESSMENT AND PLAN: .    Problem List Items Addressed This Visit       Cardiology Problems   Hyperlipidemia associated with type 2 diabetes mellitus (HCC)   Was supposed to 5 lipids checked in  April but they were not done. He was taking Zetia  and tolerating well, but apparently Nexletol  cause a headache.  He had only taken 2 doses, and could very well had a headache with or without the medication. Intolerance to statins, currently on Zetia . Plans to restart Nexletol . Recent cholesterol levels not checked since January. - Restart Nexletol  and continue Zetia . - Order fasting lipid panel to assess current cholesterol levels. - Monitor cholesterol levels and adjust treatment as needed. - Consider more aggressive treatment if cholesterol levels do not improve.  - Order fasting lipid panel to assess current cholesterol levels. - Monitor cholesterol levels and adjust treatment as needed. - Consider more aggressive treatment if cholesterol levels do not improve.      Relevant Medications   Bempedoic Acid  (NEXLETOL ) 180 MG TABS   Primary hypertension (Chronic)   Blood pressure around 140/70 mmHg.  On losartan  25 mg. Better control needed for cardiovascular health. - Increase losartan  to 50 mg after one week of tolerating Nexletol . - Monitor blood pressure regularly and adjust treatment as needed. - Advise doubling losartan  dose during episodes of acute blood pressure elevation, such as after steroid injections.       Relevant Medications   Bempedoic Acid  (NEXLETOL ) 180 MG TABS     Other   Claudication in peripheral vascular disease (HCC) (Chronic)   Seen by Dr. Deatrice Cage.  Plan is follow-up in October.  Discussed importance of continuing his walking program although he is not really noticing much benefit from cilostazol .. Continue follow-up with Dr. Cage for potential intervention when ready. - Encourage smoking cessation to prevent disease progression. - Continue ambulation to promote collateral circulation.      Current every day smoker (Chronic)   Long-standing disorder, smoking one pack per day. Smoking cessation crucial for managing peripheral artery disease. - Encourage gradual reduction in smoking, aiming to decrease by 1-2 cigarettes per day weekly. - Discuss potential use of Nicorette gum, patches, or e-cigarettes as cessation aids once smoking is reduced. - Emphasize the importance of smoking cessation in managing peripheral artery disease.  4 minutes spent discussing smoking cessation.      Statin myopathy (Chronic)   Has tried several statins in the past all of which have caused the headaches or myalgias. Currently tolerating Zetia  and hopefully will tolerate initiation of Nexletol . May need to consider more aggressive options such as PCSK9 inhibitors although I think Leqvio would also be a good option..      Type 2 diabetes mellitus with complication, without long-term current use of insulin (HCC) (Chronic)   Last A1c was 6.9: Appears that he is on Actos and Amaryl.-Management PCP. Would strongly consider stopping Actos and considering GLP-1 agonist or SGLT2 monitor.      Other Visit Diagnoses       Medication management    -  Primary   Relevant Orders   Comprehensive metabolic panel with GFR   Lipid panel   Hepatic function panel       He has significant other vascular risk factors and likely has pretty significant PAD which would also be a coronary disease risk  factor.  We are treating blood pressure and lipids and PCP is treating diabetes.  We discussed cessation.  We did not discuss stress testing or coronary calcium score at this time although progressive lipid management is recommended, I think that would be reasonable to proceed with at least Coronary Calcium Score.         Follow-Up: Return in about  6 months (around 05/15/2024) for [place] office.     Signed, Alm MICAEL Clay, MD, MS Alm Clay, M.D., M.S. Interventional Chartered certified accountant  Pager # [phone]

## 2023-11-13 NOTE — Telephone Encounter (Signed)
 Pharmacy Patient Advocate Encounter  Insurance verification completed.   The patient is insured through [employer]   Ran test claim for NEXLETOL . Currently a quantity of 30 is a 30 day supply and the co-pay is NA, NON FORM ON PLAN . PLAN PREFERS REPATHA. ATTEMPTING NEXLETOL  PA.    This test claim was processed through [facility]- copay amounts may vary at other pharmacies due to pharmacy/plan contracts, or as the patient moves through the different stages of their insurance plan.

## 2023-11-13 NOTE — Telephone Encounter (Signed)
 Pt c/o medication issue:  1. Name of Medication: losartan  (COZAAR ) 50 MG tablet   Bempedoic Acid  (NEXLETOL ) 180 MG TABS   2. How are you currently taking this medication (dosage and times per day)? As written  3. Are you having a reaction (difficulty breathing--STAT)? No   4. What is your medication issue? Pharmacy wants to know if we change the script of Losartan  to 25 mg instead of the 50 mg so pt doesn't have to cut them in two.  Nexetol needs prior auth and pharmacy has started it

## 2023-11-13 NOTE — Telephone Encounter (Signed)
 Pharmacy Patient Advocate Encounter   Received notification from CoverMyMeds that prior authorization for NEXLETOL  is required/requested.   Insurance verification completed.   The patient is insured through [place] .   Per test claim: PA required; PA submitted to above mentioned insurance via CoverMyMeds Key/confirmation #/EOC [id number] Status is pending

## 2023-11-13 NOTE — Patient Instructions (Signed)
 Medication Instructions:  Your physician recommends the following medication changes.   START TAKING: Restart Nexletol  180 mg once daily.   INCREASE: Losartan  (Cozaar ) 50 mg once daily if you are doing well after one week of restarting Nexletol .     *If you need a refill on your cardiac medications before your next appointment, please call your pharmacy*  Lab Work: Your provider would like for you to return in Within One Week to have the following labs drawn: CMP, Hepatic Panel, Lipid Panel.   Please go to [facility] [address] [address], [place] [address] You do not need an appointment.  They are open from 8 am- 4:30 pm.  Lunch from 1:00 pm- 2:00 pm You DO need to be fasting.   You may also go to one of the following [facility]:  [address] [place], [place] [address] Phone: [phone] Lab hours: Mon-Fri 8 am- 5 pm    Lunch 12 pm- 1 pm  [address] [place],  [place]  [address]  [place]  Phone: [phone] Lab hours: 7 am- 4 pm Lunch 12 pm-1 pm   [address] [place],  [place]  [address]  [place]  Phone: [phone] Lab hours: Mon-Fri 8 am- 5 pm    Lunch 12 pm- 1 pm  If you have labs (blood work) drawn today and your tests are completely normal, you will receive your results only by: MyChart Message (if you have MyChart) OR A paper copy in the mail If you have any lab test that is abnormal or we need to change your treatment, we will call you to review the results.  Testing/Procedures: None ordered at this time   Follow-Up: At [facility], you and your health needs are our priority.  As part of our continuing mission to provide you with exceptional heart care, our providers are all part of one team.  This team includes your primary Cardiologist (physician) and Advanced Practice Providers or APPs (Physician Assistants and Nurse Practitioners) who all work together to provide you with the care you need, when you need it.  Your  next appointment:   6 month(s)  Provider:   You will see one of the following Advanced Practice Providers on your designated Care Team:   Lonni Meager, NP Lesley Maffucci, PA-C Bernardino Bring, PA-C Cadence Lunenburg, PA-C Tylene Lunch, NP Barnie Hila, NP      We recommend signing up for the patient portal called MyChart.  Sign up information is provided on this After Visit Summary.  MyChart is used to connect with patients for Virtual Visits (Telemedicine).  Patients are able to view lab/test results, encounter notes, upcoming appointments, etc.  Non-urgent messages can be sent to your provider as well.   To learn more about what you can do with MyChart, go to [email or web address].

## 2023-11-13 NOTE — Telephone Encounter (Signed)
 Called and spoke with pharmacy. Sent in updated prescription.

## 2023-11-14 NOTE — Telephone Encounter (Signed)
 Pharmacy Patient Advocate Encounter  Received notification from [employer] that Prior Authorization for NEXLETOL  has been DENIED.  Full denial letter will be uploaded to the media tab. See denial reason below.  PER PLAN MUST TRY AND FAIL REPATHA FIRST. NEXLETOL  NON FORM; PLAN PREFERS REPATHA.

## 2023-11-17 ENCOUNTER — Emergency Department (HOSPITAL_COMMUNITY)
Admission: EM | Admit: 2023-11-17 | Discharge: 2023-11-17 | Disposition: A | Discharge status: Home / Self Care | Attending: Emergency Medicine | Admitting: Emergency Medicine

## 2023-11-17 ENCOUNTER — Emergency Department (HOSPITAL_COMMUNITY)

## 2023-11-17 ENCOUNTER — Other Ambulatory Visit: Payer: Self-pay

## 2023-11-17 DIAGNOSIS — Z7982 Long term (current) use of aspirin: Secondary | ICD-10-CM | POA: Diagnosis not present

## 2023-11-17 DIAGNOSIS — R1013 Epigastric pain: Secondary | ICD-10-CM | POA: Diagnosis present

## 2023-11-17 DIAGNOSIS — K2100 Gastro-esophageal reflux disease with esophagitis, without bleeding: Secondary | ICD-10-CM | POA: Diagnosis not present

## 2023-11-17 LAB — CBC WITH DIFFERENTIAL/PLATELET
Abs Immature Granulocytes: 0.06 K/uL (ref 0.00–0.07)
Basophils Absolute: 0.1 K/uL (ref 0.0–0.1)
Basophils Relative: 1 %
Eosinophils Absolute: 0.3 K/uL (ref 0.0–0.5)
Eosinophils Relative: 3 %
HCT: 45.1 % (ref 39.0–52.0)
Hemoglobin: 14.7 g/dL (ref 13.0–17.0)
Immature Granulocytes: 1 %
Lymphocytes Relative: 26 %
Lymphs Abs: 2.5 K/uL (ref 0.7–4.0)
MCH: 30.6 pg (ref 26.0–34.0)
MCHC: 32.6 g/dL (ref 30.0–36.0)
MCV: 93.8 fL (ref 80.0–100.0)
Monocytes Absolute: 0.7 K/uL (ref 0.1–1.0)
Monocytes Relative: 7 %
Neutro Abs: 6 K/uL (ref 1.7–7.7)
Neutrophils Relative %: 62 %
Platelets: 239 K/uL (ref 150–400)
RBC: 4.81 MIL/uL (ref 4.22–5.81)
RDW: 13.1 % (ref 11.5–15.5)
WBC: 9.5 K/uL (ref 4.0–10.5)
nRBC: 0 % (ref 0.0–0.2)

## 2023-11-17 LAB — COMPREHENSIVE METABOLIC PANEL WITH GFR
ALT: 13 U/L (ref 0–44)
AST: 21 U/L (ref 15–41)
Albumin: 3.4 g/dL — ABNORMAL LOW (ref 3.5–5.0)
Alkaline Phosphatase: 68 U/L (ref 38–126)
Anion gap: 11 (ref 5–15)
BUN: 15 mg/dL (ref 8–23)
CO2: 22 mmol/L (ref 22–32)
Calcium: 9 mg/dL (ref 8.9–10.3)
Chloride: 100 mmol/L (ref 98–111)
Creatinine, Ser: 0.82 mg/dL (ref 0.61–1.24)
GFR, Estimated: >60 mL/min (ref >60)
Glucose, Bld: 275 mg/dL — ABNORMAL HIGH (ref 70–99)
Potassium: 4.6 mmol/L (ref 3.5–5.1)
Sodium: 133 mmol/L — ABNORMAL LOW (ref 135–145)
Total Bilirubin: 1 mg/dL (ref 0.0–1.2)
Total Protein: 6.2 g/dL — ABNORMAL LOW (ref 6.5–8.1)

## 2023-11-17 LAB — TROPONIN I (HIGH SENSITIVITY)
Troponin I (High Sensitivity): 6 ng/L (ref <18)
Troponin I (High Sensitivity): 6 ng/L (ref <18)

## 2023-11-17 LAB — LIPASE, BLOOD: Lipase: 34 U/L (ref 11–51)

## 2023-11-17 MED ORDER — ONDANSETRON HCL 4 MG/2ML IJ SOLN
4.0000 mg | Freq: Once | INTRAMUSCULAR | Status: AC
  Administered 2023-11-17: 4 mg via INTRAVENOUS
  Filled 2023-11-17: qty 2

## 2023-11-17 MED ORDER — ALUM & MAG HYDROXIDE-SIMETH 200-200-20 MG/5ML PO SUSP
30.0000 mL | Freq: Once | ORAL | Status: AC
  Administered 2023-11-17: 30 mL via ORAL
  Filled 2023-11-17: qty 30

## 2023-11-17 MED ORDER — SUCRALFATE 1 G PO TABS
1.0000 g | ORAL_TABLET | Freq: Once | ORAL | Status: AC
  Administered 2023-11-17: 1 g via ORAL
  Filled 2023-11-17: qty 1

## 2023-11-17 MED ORDER — MORPHINE SULFATE (PF) 2 MG/ML IV SOLN
4.0000 mg | Freq: Once | INTRAVENOUS | Status: AC
  Administered 2023-11-17: 4 mg via INTRAVENOUS
  Filled 2023-11-17: qty 2

## 2023-11-17 MED ORDER — PANTOPRAZOLE SODIUM 20 MG PO TBEC: 20.0000 mg | DELAYED_RELEASE_TABLET | Freq: Every day | ORAL | 0 refills | Status: AC

## 2023-11-17 MED ORDER — SUCRALFATE 1 G PO TABS: 1.0000 g | ORAL_TABLET | Freq: Four times a day (QID) | ORAL | 0 refills | Status: DC

## 2023-11-17 MED ORDER — HYOSCYAMINE SULFATE 0.125 MG SL SUBL
0.2500 mg | SUBLINGUAL_TABLET | Freq: Once | SUBLINGUAL | Status: AC
  Administered 2023-11-17: 0.25 mg via SUBLINGUAL
  Filled 2023-11-17: qty 2

## 2023-11-17 NOTE — ED Provider Notes (Addendum)
 [name] Provider Note   CSN: [id number] Arrival date & time: 11/17/23  1121     Patient presents with: Chest Pain   Mathew Lamb is a 73 y.o. male.   73 year old who presents with epigastric pain which began last night after eating dirty rice.  Patient states he took antacids which relieved his symptoms.  States they again returned today shortly after he had peanut butter.  Describes as a burning sensation was goes up into the back of his throat.  It does not radiate to his back or down his arms.  It has not been associated with shortness of breath, dyspnea, diaphoresis.  No exertional component to it.  Notes that belching does seem to relieve his symptoms.       Prior to Admission medications   Medication Sig Start Date End Date Taking? Authorizing Provider  acetaminophen  (TYLENOL ) 500 MG tablet Take 1,000 mg by mouth every 6 (six) hours as needed for pain. Reported on 09/06/2015    [provider]  ALPRAZolam (XANAX) 0.5 MG tablet Take 0.5 mg by mouth daily as needed. 01/14/22   [provider]  aspirin  EC 81 MG tablet Take 1 tablet (81 mg total) by mouth daily. Swallow whole. 08/07/23   Anner Alm ORN, MD  Bempedoic Acid  (NEXLETOL ) 180 MG TABS Take 1 tablet (180 mg total) by mouth daily. 11/13/23   Anner Alm ORN, MD  celecoxib (CELEBREX) 100 MG capsule Take 100 mg by mouth 2 (two) times daily.    [provider]  cetirizine (ZYRTEC) 10 MG tablet Take 10 mg by mouth daily.    [provider]  cilostazol  (PLETAL ) 50 MG tablet Take 1 tablet (50 mg total) by mouth 2 (two) times daily before a meal. 10/14/23   Darron Deatrice LABOR, MD  diclofenac  sodium (VOLTAREN ) 1 % GEL Apply 2 g topically 4 (four) times daily. 09/12/18   Law, Alexandra M, PA-C  ezetimibe  (ZETIA ) 10 MG tablet Take 1 tablet (10 mg total) by mouth daily. 09/18/23 12/17/23  Anner Alm ORN, MD  gabapentin (NEURONTIN) 300 MG capsule Take 300 mg  by mouth daily. 01/14/22   [provider]  glimepiride (AMARYL) 4 MG tablet Take 4 mg by mouth 2 (two) times daily.    [provider]  losartan  (COZAAR ) 50 MG tablet Take 1 tablet (50 mg total) by mouth daily. 11/13/23   Anner Alm ORN, MD  pioglitazone (ACTOS) 45 MG tablet Take 45 mg by mouth daily. 01/14/22   [provider]  tamsulosin  (FLOMAX ) 0.4 MG CAPS capsule Take 1 capsule (0.4 mg total) by mouth daily. 10/20/14   Kassie Ozell SAUNDERS, MD    Allergies: Cephalexin, Penicillin g, Cyclobenzaprine hcl, Oxycodone -aspirin , Pentazocine lactate, Actos [pioglitazone hydrochloride], Ciprofloxacin , Hydrocodone , Meperidine hcl, Meperidine hcl, Penicillins, Codeine, Diclofenac  sodium, and Metformin    Review of Systems  All other systems reviewed and are negative.   Updated Vital Signs BP (!) 147/72 (BP Location: Right Arm)   Pulse (!) 104   Temp 97.9 F (36.6 C)   Resp 19   SpO2 97%   Physical Exam Vitals and nursing note reviewed.  Constitutional:      General: He is not in acute distress.    Appearance: Normal appearance. He is well-developed. He is not toxic-appearing.  HENT:     Head: Normocephalic and atraumatic.  Eyes:     General: Lids are normal.     Conjunctiva/sclera: Conjunctivae normal.  Pupils: Pupils are equal, round, and reactive to light.  Neck:     Thyroid : No thyroid  mass.     Trachea: No tracheal deviation.  Cardiovascular:     Rate and Rhythm: Normal rate and regular rhythm.     Heart sounds: Normal heart sounds. No murmur heard.    No gallop.  Pulmonary:     Effort: Pulmonary effort is normal. No respiratory distress.     Breath sounds: Normal breath sounds. No stridor. No decreased breath sounds, wheezing, rhonchi or rales.  Abdominal:     General: There is no distension.     Palpations: Abdomen is soft.     Tenderness: There is no abdominal tenderness. There is no rebound.  Musculoskeletal:        General: No tenderness.  Normal range of motion.     Cervical back: Normal range of motion and neck supple.  Skin:    General: Skin is warm and dry.     Findings: No abrasion or rash.  Neurological:     Mental Status: He is alert and oriented to person, place, and time. Mental status is at baseline.     GCS: GCS eye subscore is 4. GCS verbal subscore is 5. GCS motor subscore is 6.     Cranial Nerves: No cranial nerve deficit.     Sensory: No sensory deficit.     Motor: Motor function is intact.  Psychiatric:        Attention and Perception: Attention normal.        Speech: Speech normal.        Behavior: Behavior normal.     (all labs ordered are listed, but only abnormal results are displayed) Labs Reviewed  COMPREHENSIVE METABOLIC PANEL WITH GFR - Abnormal; Notable for the following components:      Result Value   Sodium 133 (*)    Glucose, Bld 275 (*)    Total Protein 6.2 (*)    Albumin 3.4 (*)    All other components within normal limits  CBC WITH DIFFERENTIAL/PLATELET  LIPASE, BLOOD  TROPONIN I (HIGH SENSITIVITY)  TROPONIN I (HIGH SENSITIVITY)    EKG: EKG Interpretation Date/Time:  Monday November 17 2023 11:36:34 EDT Ventricular Rate:  91 PR Interval:  178 QRS Duration:  88 QT Interval:  338 QTC Calculation: 415 R Axis:   8  Text Interpretation: Sinus rhythm with occasional Premature ventricular complexes Otherwise normal ECG When compared with ECG of 07-Aug-2023 10:10, PREVIOUS ECG IS PRESENT No significant change since last tracing Confirmed by Dasie Faden ([id number]) on 11/17/2023 1:54:06 PM  Radiology: [facility] Chest 1 View Result Date: 11/17/2023 CLINICAL DATA:  Chest pain. EXAM: CHEST  1 VIEW COMPARISON:  Radiographs 09/12/2018 and 05/16/2012. FINDINGS: 1229 hours. The heart size and mediastinal contours are normal. The lungs are clear. There is no pleural effusion or pneumothorax. No acute osseous findings are identified. IMPRESSION: No evidence of acute cardiopulmonary process. Electronically  Signed   By: Elsie Perone M.D.   On: 11/17/2023 12:51     Procedures   Medications Ordered in the ED  alum & mag hydroxide-simeth (MAALOX/MYLANTA) 200-200-20 MG/5ML suspension 30 mL (has no administration in time range)  hyoscyamine  (LEVSIN  SL) SL tablet 0.25 mg (has no administration in time range)  sucralfate  (CARAFATE ) tablet 1 g (has no administration in time range)  ondansetron  (ZOFRAN ) injection 4 mg (4 mg Intravenous Given 11/17/23 1200)  morphine  (PF) 2 MG/ML injection 4 mg (4 mg Intravenous Given 11/17/23 1200)  alum & mag hydroxide-simeth (MAALOX/MYLANTA) 200-200-20 MG/5ML suspension 30 mL (30 mLs Oral Given 11/17/23 1200)                                    Medical Decision Making Risk OTC drugs. Prescription drug management.   Patient's EKG shows normal sinus rhythm.  No signs of acute ischemic changes noted.  For troponin here is negative.  Suspect patient has reflux type symptoms.  He was treated with morphine  by the triage provider and patient's symptoms are better.  Labs are unremarkable otherwise.  No evidence of pancreatitis.  Patient has no evidence of biliary disease via labs.  Plan will be for patient have a second troponin and if negative he will go home.  3:34 PM Patient second troponin negative here.  Feels better after medication.  Will discharge CRITICAL CARE Performed by: Curtistine ONEIDA Dawn Total critical care time: 45 minutes Critical care time was exclusive of separately billable procedures and treating other patients. Critical care was necessary to treat or prevent imminent or life-threatening deterioration. Critical care was time spent personally by me on the following activities: development of treatment plan with patient and/or surrogate as well as nursing, discussions with consultants, evaluation of patient's response to treatment, examination of patient, obtaining history from patient or surrogate, ordering and performing treatments and interventions,  ordering and review of laboratory studies, ordering and review of radiographic studies, pulse oximetry and re-evaluation of patient's condition.     Final diagnoses:  None    ED Discharge Orders     None          Dawn Curtistine, MD 11/17/23 1407    Dawn Curtistine, MD 11/17/23 1534

## 2023-11-17 NOTE — ED Triage Notes (Signed)
 Pt arrived via POV from home reporting severe center to upper chest burning, Indigestion and pain starting approx 45 minutes ago. Pt took 2 81mg  ASA this am. Similar discomfort last night took otc antiacids with some relief.

## 2023-11-17 NOTE — ED Provider Triage Note (Signed)
 Emergency Medicine Provider Triage Evaluation Note  DONAVEN CRISWELL , a 73 y.o. male  was evaluated in triage.  Pt complains of chest pain that started last night and resolved with anti-acids and return today.  Minimal SOB and lots of burping.  Started a new lipid med 3 days ago  Review of Systems  Positive: Chest pain, burping Negative: Abd pain, vomiting.  Physical Exam  BP (!) 147/72 (BP Location: Right Arm)   Pulse (!) 104   Temp 97.9 F (36.6 C)   Resp 19   SpO2 97%  Gen:   Awake, no distress   Resp:  Normal effort  MSK:   Moves extremities without difficulty  Other:  Mild tachycardia.  Abd without pain  Medical Decision Making  Medically screening exam initiated at 11:49 AM.  Appropriate orders placed.  JAKUB DEBOLD was informed that the remainder of the evaluation will be completed by another provider, this initial triage assessment does not replace that evaluation, and the importance of remaining in the ED until their evaluation is complete.     Doretha Folks, MD 11/17/23 1153

## 2023-11-19 ENCOUNTER — Encounter: Payer: Self-pay | Admitting: Cardiology

## 2023-11-19 DIAGNOSIS — G720 Drug-induced myopathy: Secondary | ICD-10-CM | POA: Insufficient documentation

## 2023-11-19 DIAGNOSIS — F17200 Nicotine dependence, unspecified, uncomplicated: Secondary | ICD-10-CM | POA: Insufficient documentation

## 2023-11-19 NOTE — Assessment & Plan Note (Signed)
 Blood pressure around 140/70 mmHg.  On losartan  25 mg. Better control needed for cardiovascular health. - Increase losartan  to 50 mg after one week of tolerating Nexletol . - Monitor blood pressure regularly and adjust treatment as needed. - Advise doubling losartan  dose during episodes of acute blood pressure elevation, such as after steroid injections.

## 2023-11-19 NOTE — Assessment & Plan Note (Signed)
 Has tried several statins in the past all of which have caused the headaches or myalgias. Currently tolerating Zetia  and hopefully will tolerate initiation of Nexletol . May need to consider more aggressive options such as PCSK9 inhibitors although I think Leqvio would also be a good option.SABRA

## 2023-11-19 NOTE — Assessment & Plan Note (Addendum)
 Long-standing disorder, smoking one pack per day. Smoking cessation crucial for managing peripheral artery disease. - Encourage gradual reduction in smoking, aiming to decrease by 1-2 cigarettes per day weekly. - Discuss potential use of Nicorette gum, patches, or e-cigarettes as cessation aids once smoking is reduced. - Emphasize the importance of smoking cessation in managing peripheral artery disease.  4 minutes spent discussing smoking cessation.

## 2023-11-19 NOTE — Assessment & Plan Note (Addendum)
 Was supposed to 5 lipids checked in April but they were not done. He was taking Zetia  and tolerating well, but apparently Nexletol  cause a headache.  He had only taken 2 doses, and could very well had a headache with or without the medication. Intolerance to statins, currently on Zetia . Plans to restart Nexletol . Recent cholesterol levels not checked since January. - Restart Nexletol  and continue Zetia . - Order fasting lipid panel to assess current cholesterol levels. - Monitor cholesterol levels and adjust treatment as needed. - Consider more aggressive treatment if cholesterol levels do not improve.  - Order fasting lipid panel to assess current cholesterol levels. - Monitor cholesterol levels and adjust treatment as needed. - Consider more aggressive treatment if cholesterol levels do not improve.

## 2023-11-19 NOTE — Assessment & Plan Note (Signed)
 Last A1c was 6.9: Appears that he is on Actos and Amaryl.-Management PCP. Would strongly consider stopping Actos and considering GLP-1 agonist or SGLT2 monitor.

## 2023-11-19 NOTE — Assessment & Plan Note (Signed)
 Seen by Dr. Deatrice Cage.  Plan is follow-up in October.  Discussed importance of continuing his walking program although he is not really noticing much benefit from cilostazol .. Continue follow-up with Dr. Cage for potential intervention when ready. - Encourage smoking cessation to prevent disease progression. - Continue ambulation to promote collateral circulation.

## 2023-11-19 NOTE — Telephone Encounter (Signed)
 If Nexletol  is truly not approved, then I would like to move forward with seeing if we can get him approved for PCSK9 Hamiter or Candida Alm Clay, MD

## 2023-11-19 NOTE — Assessment & Plan Note (Signed)
 He has significant other vascular risk factors and likely has pretty significant PAD which would also be a coronary disease risk factor.  We are treating blood pressure and lipids and PCP is treating diabetes.  We discussed cessation.  We did not discuss stress testing or coronary calcium score at this time although progressive lipid management is recommended, I think that would be reasonable to proceed with at least Coronary Calcium Score.

## 2023-11-20 ENCOUNTER — Telehealth: Payer: Self-pay | Admitting: Pharmacy Technician

## 2023-11-20 ENCOUNTER — Other Ambulatory Visit (HOSPITAL_COMMUNITY): Payer: Self-pay

## 2023-11-20 NOTE — Telephone Encounter (Signed)
 Pharmacy Patient Advocate Encounter  Received notification from [employer] that Prior Authorization for repatha 140mg  has been APPROVED from 11/20/23 to 05/05/24. Ran test claim, Copay is $47.00. This test claim was processed through [facility]- copay amounts may vary at other pharmacies due to pharmacy/plan contracts, or as the patient moves through the different stages of their insurance plan.   PA #/Case ID/Reference #: [id number]

## 2023-11-20 NOTE — Telephone Encounter (Signed)
 PA request has been Submitted. New Encounter has been or will be created for follow up. For additional info see Pharmacy Prior Auth telephone encounter from 11/20/23.

## 2023-11-20 NOTE — Telephone Encounter (Signed)
 Pt calls  Pharmacy Patient Advocate Encounter   Received notification from Pt Calls Messages that prior authorization for repatha 140mg  is required/requested.   Insurance verification completed.   The patient is insured through [place] .   Per test claim: PA required; PA submitted to above mentioned insurance via latent Key/confirmation #/EOC [id number] Status is pending

## 2023-11-21 MED ORDER — REPATHA SURECLICK 140 MG/ML ~~LOC~~ SOAJ: 140.0000 mg | SUBCUTANEOUS | 3 refills | Status: AC

## 2023-11-21 NOTE — Addendum Note (Signed)
 Addended by: Karlos Scadden L on: 11/21/2023 02:48 PM   Modules accepted: Orders

## 2024-02-19 ENCOUNTER — Ambulatory Visit (INDEPENDENT_AMBULATORY_CARE_PROVIDER_SITE_OTHER)

## 2024-02-19 ENCOUNTER — Ambulatory Visit: Admitting: Podiatry

## 2024-02-19 ENCOUNTER — Encounter: Payer: Self-pay | Admitting: Podiatry

## 2024-02-19 VITALS — Ht 66.0 in | Wt 225.4 lb

## 2024-02-19 DIAGNOSIS — L84 Corns and callosities: Secondary | ICD-10-CM | POA: Diagnosis not present

## 2024-02-19 DIAGNOSIS — L97522 Non-pressure chronic ulcer of other part of left foot with fat layer exposed: Secondary | ICD-10-CM | POA: Diagnosis not present

## 2024-02-19 DIAGNOSIS — E1151 Type 2 diabetes mellitus with diabetic peripheral angiopathy without gangrene: Secondary | ICD-10-CM | POA: Diagnosis not present

## 2024-02-20 NOTE — Progress Notes (Signed)
 Subjective:   Patient ID: Mathew Lamb, male   DOB: 73 y.o.   MRN: [id number]   HPI Patient is concerned because he had a small open wound on the fifth digit left foot and has been very tender and patient does have diabetes in poor control and admits that his A1c is running over 10.  Patient does smoke a pack of cigarettes per day and moderately overweight and does not exercise   Review of Systems  All other systems reviewed and are negative.       Objective:  Physical Exam Vitals and nursing note reviewed.  Constitutional:      Appearance: He is well-developed.  Pulmonary:     Effort: Pulmonary effort is normal.  Musculoskeletal:        General: Normal range of motion.  Skin:    General: Skin is warm.  Neurological:     Mental Status: He is alert.     Neurovascular status found to be diminished with diminished pulses noted bilateral and patient found to have diminishment of neurological status noted.  Patient does have diminished range of motion subtalar midtarsal joint and adequate muscle strength and is found to have a lesion on the inside of the fifth digit left foot keratotic with adequate digital flow noted.     Assessment:  High risk patient with diabetes not in good control long-term smoker and moderate obesity with lesion left that appears closed at the current time but still very tender     Plan:  H&P reviewed and at this point I did careful debridement of the area I did not note any pus formation and I spent a great deal of time going over with him his diabetes is smoking his poor health habits and the danger of this presents.  I did apply dressing to the fifth toe and if it should give him any issues he is to reappoint immediately or if he should develop any open lesions or any other pathology.  X-rays indicate no signs of osteolysis around the area it appears to be more of a bone spur formation but no indication of infection

## 2024-06-23 ENCOUNTER — Ambulatory Visit: Admitting: Cardiovascular Disease
